# Patient Record
Sex: Male | Born: 1980 | Race: White | Hispanic: No | Marital: Married | Smoking: Never smoker
Health system: Southern US, Community
[De-identification: ages and names within clinical notes are randomized; demographics above are authoritative.]

## PROBLEM LIST (undated history)

## (undated) DIAGNOSIS — F509 Eating disorder, unspecified: Secondary | ICD-10-CM

## (undated) DIAGNOSIS — F419 Anxiety disorder, unspecified: Secondary | ICD-10-CM

## (undated) DIAGNOSIS — K219 Gastro-esophageal reflux disease without esophagitis: Secondary | ICD-10-CM

## (undated) DIAGNOSIS — T7840XA Allergy, unspecified, initial encounter: Secondary | ICD-10-CM

## (undated) HISTORY — DX: Gastro-esophageal reflux disease without esophagitis: K21.9

## (undated) HISTORY — PX: OTHER SURGICAL HISTORY: SHX169

## (undated) HISTORY — PX: TESTICLE REMOVAL: SHX68

## (undated) HISTORY — DX: Eating disorder, unspecified: F50.9

## (undated) HISTORY — DX: Allergy, unspecified, initial encounter: T78.40XA

## (undated) HISTORY — DX: Anxiety disorder, unspecified: F41.9

---
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
## 2016-11-13 DIAGNOSIS — R12 Heartburn: Secondary | ICD-10-CM | POA: Insufficient documentation

## 2020-01-26 ENCOUNTER — Ambulatory Visit: Payer: Self-pay | Attending: Internal Medicine

## 2020-01-26 DIAGNOSIS — Z23 Encounter for immunization: Secondary | ICD-10-CM

## 2020-01-26 NOTE — Progress Notes (Signed)
   Covid-19 Vaccination Clinic  Name:  Kerney Hopfensperger    MRN: [id number] DOB: [birth year]  01/26/2020  Mr. January was observed post Covid-19 immunization for 30 minutes based on pre-vaccination screening without incident. He was provided with Vaccine Information Sheet and instruction to access the V-Safe system.   Mr. Voong was instructed to call 911 with any severe reactions post vaccine: Marland Kitchen Difficulty breathing  . Swelling of face and throat  . A fast heartbeat  . A bad rash all over body  . Dizziness and weakness   Immunizations Administered    Name Date Dose VIS Date Route   Pfizer COVID-19 Vaccine 01/26/2020  2:15 PM 0.3 mL 10/13/2019 Intramuscular   Manufacturer: [employer], [employer]   Lot: [id number]   NDC: [address]-4

## 2020-02-16 ENCOUNTER — Ambulatory Visit: Payer: Self-pay | Attending: Internal Medicine

## 2020-02-16 DIAGNOSIS — Z23 Encounter for immunization: Secondary | ICD-10-CM

## 2020-02-16 NOTE — Progress Notes (Signed)
   Covid-19 Vaccination Clinic  Name:  Antonio Porter    MRN: [id number] DOB: 1981/10/12  02/16/2020  Mr. Antonio Porter was observed post Covid-19 immunization for 15 minutes without incident. He was provided with Vaccine Information Sheet and instruction to access the V-Safe system.   Mr. Antonio Porter was instructed to call 911 with any severe reactions post vaccine: Marland Kitchen Difficulty breathing  . Swelling of face and throat  . A fast heartbeat  . A bad rash all over body  . Dizziness and weakness   Immunizations Administered    Name Date Dose VIS Date Route   Pfizer COVID-19 Vaccine 02/16/2020  2:03 PM 0.3 mL 10/13/2019 Intramuscular   Manufacturer: [employer], [employer]   Lot: [id number]   NDC: [address]-6

## 2020-03-05 DIAGNOSIS — F411 Generalized anxiety disorder: Secondary | ICD-10-CM | POA: Diagnosis not present

## 2020-03-19 DIAGNOSIS — F411 Generalized anxiety disorder: Secondary | ICD-10-CM | POA: Diagnosis not present

## 2020-04-02 DIAGNOSIS — F411 Generalized anxiety disorder: Secondary | ICD-10-CM | POA: Diagnosis not present

## 2020-04-16 DIAGNOSIS — Z713 Dietary counseling and surveillance: Secondary | ICD-10-CM | POA: Diagnosis not present

## 2020-04-16 DIAGNOSIS — F411 Generalized anxiety disorder: Secondary | ICD-10-CM | POA: Diagnosis not present

## 2020-04-30 DIAGNOSIS — F411 Generalized anxiety disorder: Secondary | ICD-10-CM | POA: Diagnosis not present

## 2020-05-22 DIAGNOSIS — Z713 Dietary counseling and surveillance: Secondary | ICD-10-CM | POA: Diagnosis not present

## 2020-06-11 DIAGNOSIS — F411 Generalized anxiety disorder: Secondary | ICD-10-CM | POA: Diagnosis not present

## 2020-06-14 DIAGNOSIS — Z713 Dietary counseling and surveillance: Secondary | ICD-10-CM | POA: Diagnosis not present

## 2020-06-17 ENCOUNTER — Other Ambulatory Visit (HOSPITAL_COMMUNITY): Payer: Self-pay | Admitting: Gastroenterology

## 2020-06-17 DIAGNOSIS — K3184 Gastroparesis: Secondary | ICD-10-CM | POA: Diagnosis not present

## 2020-06-17 DIAGNOSIS — R634 Abnormal weight loss: Secondary | ICD-10-CM

## 2020-06-18 ENCOUNTER — Other Ambulatory Visit (HOSPITAL_COMMUNITY): Payer: Self-pay | Admitting: Gastroenterology

## 2020-06-24 ENCOUNTER — Telehealth (HOSPITAL_COMMUNITY): Payer: Self-pay

## 2020-06-24 ENCOUNTER — Other Ambulatory Visit: Payer: Self-pay | Admitting: Gastroenterology

## 2020-06-24 DIAGNOSIS — R634 Abnormal weight loss: Secondary | ICD-10-CM

## 2020-06-24 DIAGNOSIS — K3184 Gastroparesis: Secondary | ICD-10-CM

## 2020-06-26 ENCOUNTER — Ambulatory Visit: Payer: BC Managed Care – PPO

## 2020-06-28 ENCOUNTER — Ambulatory Visit
Admission: RE | Admit: 2020-06-28 | Discharge: 2020-06-28 | Disposition: A | Discharge status: Home / Self Care | Payer: BC Managed Care – PPO | Source: Ambulatory Visit | Attending: Gastroenterology | Admitting: Gastroenterology

## 2020-06-28 ENCOUNTER — Other Ambulatory Visit: Payer: Self-pay

## 2020-06-28 DIAGNOSIS — K3184 Gastroparesis: Secondary | ICD-10-CM | POA: Insufficient documentation

## 2020-06-28 DIAGNOSIS — R109 Unspecified abdominal pain: Secondary | ICD-10-CM | POA: Diagnosis not present

## 2020-06-28 DIAGNOSIS — R634 Abnormal weight loss: Secondary | ICD-10-CM | POA: Diagnosis not present

## 2020-06-28 DIAGNOSIS — R140 Abdominal distension (gaseous): Secondary | ICD-10-CM | POA: Diagnosis not present

## 2020-06-28 IMAGING — NM NM GASTRIC EMPTYING
5 series · 16 of 16 positions shown · non-contrast
Comparison: None.

CLINICAL DATA: Abdominal pain and bloating, with progression over
last several years. Anorexia.

EXAM:
NUCLEAR MEDICINE GASTRIC EMPTYING SCAN
TECHNIQUE: After oral ingestion of radiolabeled meal, sequential abdominal
images were obtained for 4 hours. Percentage of activity emptying
the stomach was calculated at 1 hour, 2 hour, 3 hour, and 4 hours.
RADIOPHARMACEUTICALS:  1.8 mCi [UE] sulfur colloid in standardized
meal

[Series 1000: gastric statics (results) · 3.90mm/px · 4 acquisitions, 8 frames shown]
[im 1/4]
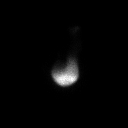
[im 1/4]
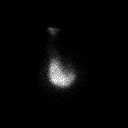
[im 2/4]
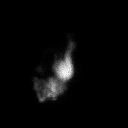
[im 2/4]
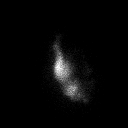
[im 3/4]
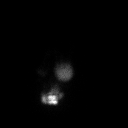
[im 3/4]
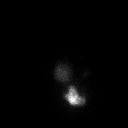
[im 4/4]
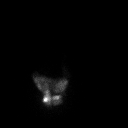
[im 4/4]
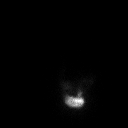

[Series 1000: gastric statics · 3.90mm/px · 2 of 2 frames shown (1 of 4)]
[frame 1/2]
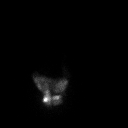
[frame 2/2]
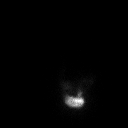

[Series 1000: gastric statics · 3.90mm/px · 2 of 2 frames shown (2 of 4)]
[frame 1/2]
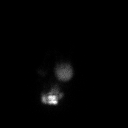
[frame 2/2]
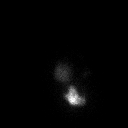

[Series 1000: gastric statics · 3.90mm/px · 2 of 2 frames shown (3 of 4)]
[frame 1/2]
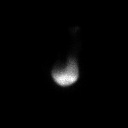
[frame 2/2]
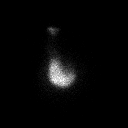

[Series 1000: gastric statics · 3.90mm/px · 2 of 2 frames shown (4 of 4)]
[frame 1/2]
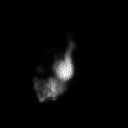
[frame 2/2]
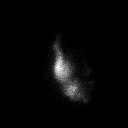

[16 of 16 positions shown; findings below may reference images not displayed]

FINDINGS: Expected location of the stomach in the left upper quadrant.
Ingested meal empties the stomach gradually over the course of the
study.

37% emptied at 1 hr ( normal >= 10%)

72% emptied at 2 hr ( normal >= 40%)

98% emptied at 3 hr ( normal >= 70%)

> 98% emptied at 4 hr ( normal >= 90%)
IMPRESSION: Normal gastric emptying study.

## 2020-06-28 MED ORDER — TECHNETIUM TC 99M SULFUR COLLOID
2.0000 | Freq: Once | INTRAVENOUS | Status: AC | PRN
  Administered 2020-06-28: 1.809 via ORAL

## 2020-07-30 DIAGNOSIS — Z713 Dietary counseling and surveillance: Secondary | ICD-10-CM | POA: Diagnosis not present

## 2020-07-31 DIAGNOSIS — K3184 Gastroparesis: Secondary | ICD-10-CM | POA: Diagnosis not present

## 2020-07-31 DIAGNOSIS — R634 Abnormal weight loss: Secondary | ICD-10-CM | POA: Diagnosis not present

## 2020-08-16 DIAGNOSIS — H52223 Regular astigmatism, bilateral: Secondary | ICD-10-CM | POA: Diagnosis not present

## 2020-08-16 DIAGNOSIS — H5213 Myopia, bilateral: Secondary | ICD-10-CM | POA: Diagnosis not present

## 2020-08-29 DIAGNOSIS — Z713 Dietary counseling and surveillance: Secondary | ICD-10-CM | POA: Diagnosis not present

## 2020-10-02 ENCOUNTER — Encounter: Payer: Self-pay | Admitting: Urology

## 2020-10-02 ENCOUNTER — Ambulatory Visit (INDEPENDENT_AMBULATORY_CARE_PROVIDER_SITE_OTHER): Payer: BC Managed Care – PPO | Admitting: Urology

## 2020-10-02 ENCOUNTER — Other Ambulatory Visit: Payer: Self-pay

## 2020-10-02 VITALS — BP 120/82 | HR 74 | Ht 70.0 in | Wt 150.0 lb

## 2020-10-02 DIAGNOSIS — Z3009 Encounter for other general counseling and advice on contraception: Secondary | ICD-10-CM

## 2020-10-02 MED ORDER — DIAZEPAM 10 MG PO TABS: ORAL_TABLET | ORAL | 0 refills | Status: DC

## 2020-10-02 NOTE — Patient Instructions (Signed)

## 2020-10-02 NOTE — Progress Notes (Signed)
10/02/2020 9:28 AM   Antonio Porter 12-Oct-1981 [id number]  Referring provider: No referring provider defined for this encounter.  Chief Complaint  Patient presents with  . VAS Consult    HPI: Antonio Porter is a 39y.o. male who presents for vasectomy counseling.  . Married with 2 children . 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 No previous history inguinal/pelvic hernia . No history of bleeding or clotting disorders   PMH: No past medical history on file.  Surgical History: Right orchidopexy Right orchiectomy  Home Medications:  Allergies as of 10/02/2020      Reactions   Other Hives   IV DYE-HIVES IN ARMPITS      Medication List       Accurate as of October 02, 2020  9:28 AM. If you have any questions, ask your nurse or doctor.        RABEprazole 20 MG tablet Commonly known as: ACIPHEX       Allergies:  Allergies  Allergen Reactions  . Other Hives    IV DYE-HIVES IN ARMPITS    Family History: No family history on file.  Social History:  reports that he has never smoked. He has never used smokeless tobacco. He reports current alcohol use. No history on file for drug use.   Physical Exam: BP 120/82   Pulse 74   Ht 5\' 10"  (1.778 m)   Wt 150 lb (68 kg)   BMI 21.52 kg/m   Constitutional:  Alert, No acute distress. HEENT: [place] AT, moist mucus membranes.  Trachea midline, no masses. Cardiovascular: No clubbing, cyanosis, or edema. Respiratory: Normal respiratory effort, no increased work of breathing. GU: Phallus circumcised without lesions.  Left testis descended, palpably normal.  Left vas easily palpable Skin: No rashes, bruises or suspicious lesions. Neurologic: Grossly intact, no focal deficits, moving all 4 extremities. Psychiatric: Normal mood and affect.   Assessment & Plan:    1.  Undesired fertility . Desires to schedule vasectomy . We had a long discussion about vasectomy. We  specifically discussed the procedure, recovery and the risks, benefits and alternatives of vasectomy. I explained that the procedure entails removal of a segment of each vas deferens, each of which conducts sperm, and that the purpose of this procedure is to cause sterility (inability to produce children or cause pregnancy). Vasectomy is intended to be permanent and irreversible form of contraception. Options for fertility after vasectomy include vasectomy reversal, or sperm retrieval with in vitro fertilization. These options are not always successful, and they may be expensive. We discussed reversible forms of birth control such as condoms, IUD or diaphragms, as well as the option of freezing sperm in a sperm bank prior to the vasectomy procedure. We discussed the importance of avoiding strenuous exercise for four days after vasectomy, and the importance of refraining from any form of ejaculation for seven days after vasectomy. I explained that vasectomy does not produce immediate sterility so another form of contraceptive must be used until sterility is assured by having semen checked for sperm. Thus, a post vasectomy semen analysis is necessary to confirm sterility. Rarely, vasectomy must be repeated. We discussed the approximately 1 in 2,000 risk of pregnancy after vasectomy for men who have post-vasectomy semen analysis showing absent sperm or rare non-motile sperm. Typical side effects include a small amount of oozing blood, some discomfort and mild swelling in the area of incision.  Vasectomy does not affect sexual performance, function, please, sensation, interest,  desire, satisfaction, penile erection, volume of semen or ejaculation. Other rare risks include allergy or adverse reaction to an anesthetic, testicular atrophy, hematoma, infection/abscess, prolonged tenderness of the vas deferens, pain, swelling, painful nodule or scar (called sperm granuloma) or epididymtis. We discussed chronic testicular  pain syndrome. This has been reported to occur in as many as 1-2% of men and may be permanent. This can be treated with medication, small procedures or (rarely) surgery. . Valium 10 mg as a preprocedure anxiolytic sent to pharmacy and he was informed he would need a driver if utilizing this medication    Riki Altes, MD  [facility] [address] [place], [place] [address] [phone]

## 2020-10-06 ENCOUNTER — Encounter: Payer: Self-pay | Admitting: Urology

## 2020-11-02 HISTORY — PX: VASECTOMY: SHX75

## 2020-11-13 DIAGNOSIS — F411 Generalized anxiety disorder: Secondary | ICD-10-CM | POA: Diagnosis not present

## 2020-11-15 ENCOUNTER — Encounter: Payer: Self-pay | Admitting: Urology

## 2020-11-15 ENCOUNTER — Ambulatory Visit (INDEPENDENT_AMBULATORY_CARE_PROVIDER_SITE_OTHER): Payer: BC Managed Care – PPO | Admitting: Urology

## 2020-11-15 ENCOUNTER — Other Ambulatory Visit: Payer: Self-pay

## 2020-11-15 VITALS — BP 101/65 | HR 67 | Ht 70.0 in | Wt 150.0 lb

## 2020-11-15 DIAGNOSIS — Z302 Encounter for sterilization: Secondary | ICD-10-CM

## 2020-11-15 MED ORDER — HYDROCODONE-ACETAMINOPHEN 5-325 MG PO TABS: 1.0000 | ORAL_TABLET | ORAL | 0 refills | Status: DC | PRN

## 2020-11-15 NOTE — Patient Instructions (Signed)

## 2020-11-18 NOTE — Progress Notes (Signed)
Vasectomy Procedure Note  Indications: The patient is a 40 y.o. male who presents today for elective sterilization.  He has been consented for the procedure.  He is aware of the risks and benefits.  He had no additional questions.  He agrees to proceed.  He denies any other significant change since his last visit.  Prior right orchiectomy  Pre-operative Diagnosis: Elective sterilization  Post-operative Diagnosis: Elective sterilization  Premedication: Valium 10 mg po  Surgeon: Lorin Picket C. Audree Schrecengost, M.D  Description: The patient was prepped and draped in the standard fashion.  The left vas deferens was identified and brought superiorly to the anterior scrotal skin.  The skin and vas was then anesthetized utilizing 5 ml 1% lidocaine.  A small stab incision was made and spread with the vas dissector.  The vas was grasped utilizing the vas clamp and elevated out of the incision.  The vas was dissected free from surrounding tissue and vessels and an ~1 cm segment was excised.  The vas lumens were cauterized utilizing electrocautery.  The distal segment was buried in the surrounding sheath with a 3-0 chromic suture.  No significant bleeding was observed.  The vas ends were then dropped back into the hemiscrotum.  The skin was closed with hemostatic pressure.  Complications:None  Recommendations: 1.  No lifting greater than 10 pounds or strenuousactivity for 1 week. 2.  Scrotal support for 1 week. 3.  Shower only for 1 week; may shower in the morning 4.  May resume intercourse in one week if no significant discomfort.  Continue alternate contraception for 12 weeks.  5.  Call for significant pain, swelling, redness, drainage or fever greater than 100.5. 6.  Rx hydrocodone/APAP 5/325 1-2 every 6 hours as needed for pain. 7.  Follow-up semen analysis in 12 weeks.  Irineo Axon, MD

## 2020-11-20 DIAGNOSIS — F411 Generalized anxiety disorder: Secondary | ICD-10-CM | POA: Diagnosis not present

## 2020-11-27 DIAGNOSIS — F411 Generalized anxiety disorder: Secondary | ICD-10-CM | POA: Diagnosis not present

## 2020-12-04 DIAGNOSIS — F411 Generalized anxiety disorder: Secondary | ICD-10-CM | POA: Diagnosis not present

## 2020-12-11 DIAGNOSIS — F411 Generalized anxiety disorder: Secondary | ICD-10-CM | POA: Diagnosis not present

## 2020-12-18 DIAGNOSIS — F411 Generalized anxiety disorder: Secondary | ICD-10-CM | POA: Diagnosis not present

## 2020-12-25 DIAGNOSIS — F411 Generalized anxiety disorder: Secondary | ICD-10-CM | POA: Diagnosis not present

## 2021-01-01 DIAGNOSIS — K3184 Gastroparesis: Secondary | ICD-10-CM | POA: Diagnosis not present

## 2021-01-01 DIAGNOSIS — F411 Generalized anxiety disorder: Secondary | ICD-10-CM | POA: Diagnosis not present

## 2021-01-01 DIAGNOSIS — R634 Abnormal weight loss: Secondary | ICD-10-CM | POA: Diagnosis not present

## 2021-01-08 DIAGNOSIS — F411 Generalized anxiety disorder: Secondary | ICD-10-CM | POA: Diagnosis not present

## 2021-01-15 DIAGNOSIS — F5089 Other specified eating disorder: Secondary | ICD-10-CM | POA: Diagnosis not present

## 2021-01-15 DIAGNOSIS — K5900 Constipation, unspecified: Secondary | ICD-10-CM | POA: Diagnosis not present

## 2021-01-15 DIAGNOSIS — E639 Nutritional deficiency, unspecified: Secondary | ICD-10-CM | POA: Diagnosis not present

## 2021-01-15 DIAGNOSIS — K3184 Gastroparesis: Secondary | ICD-10-CM | POA: Diagnosis not present

## 2021-01-15 DIAGNOSIS — R61 Generalized hyperhidrosis: Secondary | ICD-10-CM | POA: Diagnosis not present

## 2021-01-15 DIAGNOSIS — F411 Generalized anxiety disorder: Secondary | ICD-10-CM | POA: Diagnosis not present

## 2021-01-15 DIAGNOSIS — R140 Abdominal distension (gaseous): Secondary | ICD-10-CM | POA: Diagnosis not present

## 2021-01-15 DIAGNOSIS — F5082 Avoidant/restrictive food intake disorder: Secondary | ICD-10-CM | POA: Diagnosis not present

## 2021-01-15 DIAGNOSIS — E46 Unspecified protein-calorie malnutrition: Secondary | ICD-10-CM | POA: Diagnosis not present

## 2021-01-15 DIAGNOSIS — F509 Eating disorder, unspecified: Secondary | ICD-10-CM | POA: Insufficient documentation

## 2021-01-16 DIAGNOSIS — K5900 Constipation, unspecified: Secondary | ICD-10-CM | POA: Diagnosis not present

## 2021-01-16 DIAGNOSIS — K3184 Gastroparesis: Secondary | ICD-10-CM | POA: Diagnosis not present

## 2021-01-16 DIAGNOSIS — E46 Unspecified protein-calorie malnutrition: Secondary | ICD-10-CM | POA: Diagnosis not present

## 2021-01-16 DIAGNOSIS — F5089 Other specified eating disorder: Secondary | ICD-10-CM | POA: Diagnosis not present

## 2021-01-17 DIAGNOSIS — R140 Abdominal distension (gaseous): Secondary | ICD-10-CM | POA: Diagnosis not present

## 2021-01-17 DIAGNOSIS — F5082 Avoidant/restrictive food intake disorder: Secondary | ICD-10-CM | POA: Diagnosis not present

## 2021-01-21 DIAGNOSIS — F411 Generalized anxiety disorder: Secondary | ICD-10-CM | POA: Diagnosis not present

## 2021-01-21 DIAGNOSIS — R140 Abdominal distension (gaseous): Secondary | ICD-10-CM | POA: Diagnosis not present

## 2021-01-23 DIAGNOSIS — F5082 Avoidant/restrictive food intake disorder: Secondary | ICD-10-CM | POA: Diagnosis not present

## 2021-01-23 DIAGNOSIS — E46 Unspecified protein-calorie malnutrition: Secondary | ICD-10-CM | POA: Diagnosis not present

## 2021-01-23 DIAGNOSIS — K5900 Constipation, unspecified: Secondary | ICD-10-CM | POA: Diagnosis not present

## 2021-01-23 DIAGNOSIS — K3184 Gastroparesis: Secondary | ICD-10-CM | POA: Diagnosis not present

## 2021-01-24 DIAGNOSIS — F5089 Other specified eating disorder: Secondary | ICD-10-CM | POA: Diagnosis not present

## 2021-01-28 DIAGNOSIS — R140 Abdominal distension (gaseous): Secondary | ICD-10-CM | POA: Diagnosis not present

## 2021-01-28 DIAGNOSIS — F5089 Other specified eating disorder: Secondary | ICD-10-CM | POA: Diagnosis not present

## 2021-01-29 DIAGNOSIS — K3184 Gastroparesis: Secondary | ICD-10-CM | POA: Diagnosis not present

## 2021-01-29 DIAGNOSIS — F5082 Avoidant/restrictive food intake disorder: Secondary | ICD-10-CM | POA: Diagnosis not present

## 2021-01-29 DIAGNOSIS — K5900 Constipation, unspecified: Secondary | ICD-10-CM | POA: Diagnosis not present

## 2021-01-29 DIAGNOSIS — E46 Unspecified protein-calorie malnutrition: Secondary | ICD-10-CM | POA: Diagnosis not present

## 2021-01-31 DIAGNOSIS — F5082 Avoidant/restrictive food intake disorder: Secondary | ICD-10-CM | POA: Diagnosis not present

## 2021-01-31 DIAGNOSIS — R61 Generalized hyperhidrosis: Secondary | ICD-10-CM | POA: Diagnosis not present

## 2021-01-31 DIAGNOSIS — R140 Abdominal distension (gaseous): Secondary | ICD-10-CM | POA: Diagnosis not present

## 2021-02-05 DIAGNOSIS — E46 Unspecified protein-calorie malnutrition: Secondary | ICD-10-CM | POA: Diagnosis not present

## 2021-02-05 DIAGNOSIS — F5089 Other specified eating disorder: Secondary | ICD-10-CM | POA: Diagnosis not present

## 2021-02-05 DIAGNOSIS — F5082 Avoidant/restrictive food intake disorder: Secondary | ICD-10-CM | POA: Diagnosis not present

## 2021-02-05 DIAGNOSIS — R140 Abdominal distension (gaseous): Secondary | ICD-10-CM | POA: Diagnosis not present

## 2021-02-05 DIAGNOSIS — K5900 Constipation, unspecified: Secondary | ICD-10-CM | POA: Diagnosis not present

## 2021-02-05 DIAGNOSIS — K3184 Gastroparesis: Secondary | ICD-10-CM | POA: Diagnosis not present

## 2021-02-07 DIAGNOSIS — R61 Generalized hyperhidrosis: Secondary | ICD-10-CM | POA: Diagnosis not present

## 2021-02-07 DIAGNOSIS — F5089 Other specified eating disorder: Secondary | ICD-10-CM | POA: Diagnosis not present

## 2021-02-07 DIAGNOSIS — R140 Abdominal distension (gaseous): Secondary | ICD-10-CM | POA: Diagnosis not present

## 2021-02-08 DIAGNOSIS — K5900 Constipation, unspecified: Secondary | ICD-10-CM | POA: Diagnosis not present

## 2021-02-08 DIAGNOSIS — R140 Abdominal distension (gaseous): Secondary | ICD-10-CM | POA: Diagnosis not present

## 2021-02-08 DIAGNOSIS — F5082 Avoidant/restrictive food intake disorder: Secondary | ICD-10-CM | POA: Diagnosis not present

## 2021-02-08 DIAGNOSIS — E46 Unspecified protein-calorie malnutrition: Secondary | ICD-10-CM | POA: Diagnosis not present

## 2021-02-11 ENCOUNTER — Other Ambulatory Visit: Payer: BC Managed Care – PPO

## 2021-02-11 DIAGNOSIS — K3184 Gastroparesis: Secondary | ICD-10-CM | POA: Diagnosis not present

## 2021-02-11 DIAGNOSIS — E46 Unspecified protein-calorie malnutrition: Secondary | ICD-10-CM | POA: Diagnosis not present

## 2021-02-11 DIAGNOSIS — F5082 Avoidant/restrictive food intake disorder: Secondary | ICD-10-CM | POA: Diagnosis not present

## 2021-02-11 DIAGNOSIS — K5900 Constipation, unspecified: Secondary | ICD-10-CM | POA: Diagnosis not present

## 2021-02-12 DIAGNOSIS — F411 Generalized anxiety disorder: Secondary | ICD-10-CM | POA: Diagnosis not present

## 2021-02-12 DIAGNOSIS — R140 Abdominal distension (gaseous): Secondary | ICD-10-CM | POA: Diagnosis not present

## 2021-02-12 DIAGNOSIS — F5089 Other specified eating disorder: Secondary | ICD-10-CM | POA: Diagnosis not present

## 2021-02-12 DIAGNOSIS — F5082 Avoidant/restrictive food intake disorder: Secondary | ICD-10-CM | POA: Diagnosis not present

## 2021-02-13 DIAGNOSIS — R140 Abdominal distension (gaseous): Secondary | ICD-10-CM | POA: Diagnosis not present

## 2021-02-13 DIAGNOSIS — F5089 Other specified eating disorder: Secondary | ICD-10-CM | POA: Diagnosis not present

## 2021-02-13 DIAGNOSIS — F411 Generalized anxiety disorder: Secondary | ICD-10-CM | POA: Diagnosis not present

## 2021-02-13 DIAGNOSIS — F5082 Avoidant/restrictive food intake disorder: Secondary | ICD-10-CM | POA: Diagnosis not present

## 2021-02-14 DIAGNOSIS — F5082 Avoidant/restrictive food intake disorder: Secondary | ICD-10-CM | POA: Diagnosis not present

## 2021-02-14 DIAGNOSIS — F411 Generalized anxiety disorder: Secondary | ICD-10-CM | POA: Diagnosis not present

## 2021-02-14 DIAGNOSIS — F5089 Other specified eating disorder: Secondary | ICD-10-CM | POA: Diagnosis not present

## 2021-02-14 DIAGNOSIS — R140 Abdominal distension (gaseous): Secondary | ICD-10-CM | POA: Diagnosis not present

## 2021-02-15 DIAGNOSIS — F5082 Avoidant/restrictive food intake disorder: Secondary | ICD-10-CM | POA: Diagnosis not present

## 2021-02-15 DIAGNOSIS — R140 Abdominal distension (gaseous): Secondary | ICD-10-CM | POA: Diagnosis not present

## 2021-02-15 DIAGNOSIS — F5089 Other specified eating disorder: Secondary | ICD-10-CM | POA: Diagnosis not present

## 2021-02-15 DIAGNOSIS — F411 Generalized anxiety disorder: Secondary | ICD-10-CM | POA: Diagnosis not present

## 2021-02-17 DIAGNOSIS — F5089 Other specified eating disorder: Secondary | ICD-10-CM | POA: Diagnosis not present

## 2021-02-17 DIAGNOSIS — F5082 Avoidant/restrictive food intake disorder: Secondary | ICD-10-CM | POA: Diagnosis not present

## 2021-02-17 DIAGNOSIS — R140 Abdominal distension (gaseous): Secondary | ICD-10-CM | POA: Diagnosis not present

## 2021-02-17 DIAGNOSIS — F411 Generalized anxiety disorder: Secondary | ICD-10-CM | POA: Diagnosis not present

## 2021-02-18 DIAGNOSIS — F411 Generalized anxiety disorder: Secondary | ICD-10-CM | POA: Diagnosis not present

## 2021-02-18 DIAGNOSIS — F5089 Other specified eating disorder: Secondary | ICD-10-CM | POA: Diagnosis not present

## 2021-02-18 DIAGNOSIS — F5082 Avoidant/restrictive food intake disorder: Secondary | ICD-10-CM | POA: Diagnosis not present

## 2021-02-18 DIAGNOSIS — R140 Abdominal distension (gaseous): Secondary | ICD-10-CM | POA: Diagnosis not present

## 2021-02-19 DIAGNOSIS — F411 Generalized anxiety disorder: Secondary | ICD-10-CM | POA: Diagnosis not present

## 2021-02-19 DIAGNOSIS — F5089 Other specified eating disorder: Secondary | ICD-10-CM | POA: Diagnosis not present

## 2021-02-19 DIAGNOSIS — R140 Abdominal distension (gaseous): Secondary | ICD-10-CM | POA: Diagnosis not present

## 2021-02-19 DIAGNOSIS — F5082 Avoidant/restrictive food intake disorder: Secondary | ICD-10-CM | POA: Diagnosis not present

## 2021-02-20 DIAGNOSIS — F5082 Avoidant/restrictive food intake disorder: Secondary | ICD-10-CM | POA: Diagnosis not present

## 2021-02-20 DIAGNOSIS — F411 Generalized anxiety disorder: Secondary | ICD-10-CM | POA: Diagnosis not present

## 2021-02-20 DIAGNOSIS — R140 Abdominal distension (gaseous): Secondary | ICD-10-CM | POA: Diagnosis not present

## 2021-02-20 DIAGNOSIS — F5089 Other specified eating disorder: Secondary | ICD-10-CM | POA: Diagnosis not present

## 2021-02-21 DIAGNOSIS — F5082 Avoidant/restrictive food intake disorder: Secondary | ICD-10-CM | POA: Diagnosis not present

## 2021-02-21 DIAGNOSIS — F411 Generalized anxiety disorder: Secondary | ICD-10-CM | POA: Diagnosis not present

## 2021-02-21 DIAGNOSIS — R140 Abdominal distension (gaseous): Secondary | ICD-10-CM | POA: Diagnosis not present

## 2021-02-21 DIAGNOSIS — F5089 Other specified eating disorder: Secondary | ICD-10-CM | POA: Diagnosis not present

## 2021-02-22 DIAGNOSIS — F5089 Other specified eating disorder: Secondary | ICD-10-CM | POA: Diagnosis not present

## 2021-02-22 DIAGNOSIS — R140 Abdominal distension (gaseous): Secondary | ICD-10-CM | POA: Diagnosis not present

## 2021-02-22 DIAGNOSIS — F411 Generalized anxiety disorder: Secondary | ICD-10-CM | POA: Diagnosis not present

## 2021-02-22 DIAGNOSIS — F5082 Avoidant/restrictive food intake disorder: Secondary | ICD-10-CM | POA: Diagnosis not present

## 2021-02-24 DIAGNOSIS — F5089 Other specified eating disorder: Secondary | ICD-10-CM | POA: Diagnosis not present

## 2021-02-24 DIAGNOSIS — F5082 Avoidant/restrictive food intake disorder: Secondary | ICD-10-CM | POA: Diagnosis not present

## 2021-02-24 DIAGNOSIS — F411 Generalized anxiety disorder: Secondary | ICD-10-CM | POA: Diagnosis not present

## 2021-02-24 DIAGNOSIS — R140 Abdominal distension (gaseous): Secondary | ICD-10-CM | POA: Diagnosis not present

## 2021-02-25 DIAGNOSIS — F5089 Other specified eating disorder: Secondary | ICD-10-CM | POA: Diagnosis not present

## 2021-02-25 DIAGNOSIS — F5082 Avoidant/restrictive food intake disorder: Secondary | ICD-10-CM | POA: Diagnosis not present

## 2021-02-25 DIAGNOSIS — R140 Abdominal distension (gaseous): Secondary | ICD-10-CM | POA: Diagnosis not present

## 2021-02-25 DIAGNOSIS — F411 Generalized anxiety disorder: Secondary | ICD-10-CM | POA: Diagnosis not present

## 2021-02-26 DIAGNOSIS — F5089 Other specified eating disorder: Secondary | ICD-10-CM | POA: Diagnosis not present

## 2021-02-26 DIAGNOSIS — F5082 Avoidant/restrictive food intake disorder: Secondary | ICD-10-CM | POA: Diagnosis not present

## 2021-02-26 DIAGNOSIS — R140 Abdominal distension (gaseous): Secondary | ICD-10-CM | POA: Diagnosis not present

## 2021-02-26 DIAGNOSIS — F411 Generalized anxiety disorder: Secondary | ICD-10-CM | POA: Diagnosis not present

## 2021-02-27 DIAGNOSIS — F411 Generalized anxiety disorder: Secondary | ICD-10-CM | POA: Diagnosis not present

## 2021-02-27 DIAGNOSIS — R140 Abdominal distension (gaseous): Secondary | ICD-10-CM | POA: Diagnosis not present

## 2021-02-27 DIAGNOSIS — F5089 Other specified eating disorder: Secondary | ICD-10-CM | POA: Diagnosis not present

## 2021-02-27 DIAGNOSIS — F5082 Avoidant/restrictive food intake disorder: Secondary | ICD-10-CM | POA: Diagnosis not present

## 2021-02-28 DIAGNOSIS — F5089 Other specified eating disorder: Secondary | ICD-10-CM | POA: Diagnosis not present

## 2021-02-28 DIAGNOSIS — R140 Abdominal distension (gaseous): Secondary | ICD-10-CM | POA: Diagnosis not present

## 2021-02-28 DIAGNOSIS — F5082 Avoidant/restrictive food intake disorder: Secondary | ICD-10-CM | POA: Diagnosis not present

## 2021-02-28 DIAGNOSIS — F411 Generalized anxiety disorder: Secondary | ICD-10-CM | POA: Diagnosis not present

## 2021-03-01 DIAGNOSIS — R140 Abdominal distension (gaseous): Secondary | ICD-10-CM | POA: Diagnosis not present

## 2021-03-01 DIAGNOSIS — F411 Generalized anxiety disorder: Secondary | ICD-10-CM | POA: Diagnosis not present

## 2021-03-01 DIAGNOSIS — F5082 Avoidant/restrictive food intake disorder: Secondary | ICD-10-CM | POA: Diagnosis not present

## 2021-03-01 DIAGNOSIS — F5089 Other specified eating disorder: Secondary | ICD-10-CM | POA: Diagnosis not present

## 2021-03-03 DIAGNOSIS — F5089 Other specified eating disorder: Secondary | ICD-10-CM | POA: Diagnosis not present

## 2021-03-03 DIAGNOSIS — R140 Abdominal distension (gaseous): Secondary | ICD-10-CM | POA: Diagnosis not present

## 2021-03-03 DIAGNOSIS — F5082 Avoidant/restrictive food intake disorder: Secondary | ICD-10-CM | POA: Diagnosis not present

## 2021-03-03 DIAGNOSIS — F411 Generalized anxiety disorder: Secondary | ICD-10-CM | POA: Diagnosis not present

## 2021-03-04 DIAGNOSIS — F411 Generalized anxiety disorder: Secondary | ICD-10-CM | POA: Diagnosis not present

## 2021-03-04 DIAGNOSIS — F5082 Avoidant/restrictive food intake disorder: Secondary | ICD-10-CM | POA: Diagnosis not present

## 2021-03-04 DIAGNOSIS — F5089 Other specified eating disorder: Secondary | ICD-10-CM | POA: Diagnosis not present

## 2021-03-04 DIAGNOSIS — R140 Abdominal distension (gaseous): Secondary | ICD-10-CM | POA: Diagnosis not present

## 2021-03-05 DIAGNOSIS — F411 Generalized anxiety disorder: Secondary | ICD-10-CM | POA: Diagnosis not present

## 2021-03-05 DIAGNOSIS — F5089 Other specified eating disorder: Secondary | ICD-10-CM | POA: Diagnosis not present

## 2021-03-05 DIAGNOSIS — F5082 Avoidant/restrictive food intake disorder: Secondary | ICD-10-CM | POA: Diagnosis not present

## 2021-03-05 DIAGNOSIS — R140 Abdominal distension (gaseous): Secondary | ICD-10-CM | POA: Diagnosis not present

## 2021-03-06 DIAGNOSIS — F5082 Avoidant/restrictive food intake disorder: Secondary | ICD-10-CM | POA: Diagnosis not present

## 2021-03-06 DIAGNOSIS — F411 Generalized anxiety disorder: Secondary | ICD-10-CM | POA: Diagnosis not present

## 2021-03-06 DIAGNOSIS — R140 Abdominal distension (gaseous): Secondary | ICD-10-CM | POA: Diagnosis not present

## 2021-03-06 DIAGNOSIS — F5089 Other specified eating disorder: Secondary | ICD-10-CM | POA: Diagnosis not present

## 2021-03-07 DIAGNOSIS — F5082 Avoidant/restrictive food intake disorder: Secondary | ICD-10-CM | POA: Diagnosis not present

## 2021-03-07 DIAGNOSIS — F411 Generalized anxiety disorder: Secondary | ICD-10-CM | POA: Diagnosis not present

## 2021-03-07 DIAGNOSIS — F5089 Other specified eating disorder: Secondary | ICD-10-CM | POA: Diagnosis not present

## 2021-03-07 DIAGNOSIS — R140 Abdominal distension (gaseous): Secondary | ICD-10-CM | POA: Diagnosis not present

## 2021-03-10 DIAGNOSIS — K3184 Gastroparesis: Secondary | ICD-10-CM | POA: Diagnosis not present

## 2021-03-10 DIAGNOSIS — R140 Abdominal distension (gaseous): Secondary | ICD-10-CM | POA: Diagnosis not present

## 2021-03-10 DIAGNOSIS — F5082 Avoidant/restrictive food intake disorder: Secondary | ICD-10-CM | POA: Diagnosis not present

## 2021-03-10 DIAGNOSIS — E46 Unspecified protein-calorie malnutrition: Secondary | ICD-10-CM | POA: Diagnosis not present

## 2021-03-10 DIAGNOSIS — F411 Generalized anxiety disorder: Secondary | ICD-10-CM | POA: Diagnosis not present

## 2021-03-10 DIAGNOSIS — F5089 Other specified eating disorder: Secondary | ICD-10-CM | POA: Diagnosis not present

## 2021-03-10 DIAGNOSIS — K5900 Constipation, unspecified: Secondary | ICD-10-CM | POA: Diagnosis not present

## 2021-03-11 DIAGNOSIS — F5082 Avoidant/restrictive food intake disorder: Secondary | ICD-10-CM | POA: Diagnosis not present

## 2021-03-11 DIAGNOSIS — R140 Abdominal distension (gaseous): Secondary | ICD-10-CM | POA: Diagnosis not present

## 2021-03-11 DIAGNOSIS — F411 Generalized anxiety disorder: Secondary | ICD-10-CM | POA: Diagnosis not present

## 2021-03-11 DIAGNOSIS — F5089 Other specified eating disorder: Secondary | ICD-10-CM | POA: Diagnosis not present

## 2021-03-13 DIAGNOSIS — R140 Abdominal distension (gaseous): Secondary | ICD-10-CM | POA: Diagnosis not present

## 2021-03-13 DIAGNOSIS — F5082 Avoidant/restrictive food intake disorder: Secondary | ICD-10-CM | POA: Diagnosis not present

## 2021-03-13 DIAGNOSIS — F411 Generalized anxiety disorder: Secondary | ICD-10-CM | POA: Diagnosis not present

## 2021-03-13 DIAGNOSIS — F5089 Other specified eating disorder: Secondary | ICD-10-CM | POA: Diagnosis not present

## 2021-03-14 DIAGNOSIS — R140 Abdominal distension (gaseous): Secondary | ICD-10-CM | POA: Diagnosis not present

## 2021-03-14 DIAGNOSIS — F411 Generalized anxiety disorder: Secondary | ICD-10-CM | POA: Diagnosis not present

## 2021-03-14 DIAGNOSIS — F5089 Other specified eating disorder: Secondary | ICD-10-CM | POA: Diagnosis not present

## 2021-03-14 DIAGNOSIS — F5082 Avoidant/restrictive food intake disorder: Secondary | ICD-10-CM | POA: Diagnosis not present

## 2021-03-17 DIAGNOSIS — F5089 Other specified eating disorder: Secondary | ICD-10-CM | POA: Diagnosis not present

## 2021-03-17 DIAGNOSIS — F5082 Avoidant/restrictive food intake disorder: Secondary | ICD-10-CM | POA: Diagnosis not present

## 2021-03-17 DIAGNOSIS — F411 Generalized anxiety disorder: Secondary | ICD-10-CM | POA: Diagnosis not present

## 2021-03-17 DIAGNOSIS — R140 Abdominal distension (gaseous): Secondary | ICD-10-CM | POA: Diagnosis not present

## 2021-03-18 DIAGNOSIS — R140 Abdominal distension (gaseous): Secondary | ICD-10-CM | POA: Diagnosis not present

## 2021-03-18 DIAGNOSIS — F411 Generalized anxiety disorder: Secondary | ICD-10-CM | POA: Diagnosis not present

## 2021-03-18 DIAGNOSIS — F5089 Other specified eating disorder: Secondary | ICD-10-CM | POA: Diagnosis not present

## 2021-03-18 DIAGNOSIS — F5082 Avoidant/restrictive food intake disorder: Secondary | ICD-10-CM | POA: Diagnosis not present

## 2021-03-18 DIAGNOSIS — R61 Generalized hyperhidrosis: Secondary | ICD-10-CM | POA: Diagnosis not present

## 2021-03-19 DIAGNOSIS — F411 Generalized anxiety disorder: Secondary | ICD-10-CM | POA: Diagnosis not present

## 2021-03-19 DIAGNOSIS — R140 Abdominal distension (gaseous): Secondary | ICD-10-CM | POA: Diagnosis not present

## 2021-03-19 DIAGNOSIS — F5089 Other specified eating disorder: Secondary | ICD-10-CM | POA: Diagnosis not present

## 2021-03-19 DIAGNOSIS — F5082 Avoidant/restrictive food intake disorder: Secondary | ICD-10-CM | POA: Diagnosis not present

## 2021-03-20 DIAGNOSIS — F5089 Other specified eating disorder: Secondary | ICD-10-CM | POA: Diagnosis not present

## 2021-03-20 DIAGNOSIS — F5082 Avoidant/restrictive food intake disorder: Secondary | ICD-10-CM | POA: Diagnosis not present

## 2021-03-20 DIAGNOSIS — F411 Generalized anxiety disorder: Secondary | ICD-10-CM | POA: Diagnosis not present

## 2021-03-20 DIAGNOSIS — R140 Abdominal distension (gaseous): Secondary | ICD-10-CM | POA: Diagnosis not present

## 2021-03-24 DIAGNOSIS — F5089 Other specified eating disorder: Secondary | ICD-10-CM | POA: Diagnosis not present

## 2021-03-24 DIAGNOSIS — R140 Abdominal distension (gaseous): Secondary | ICD-10-CM | POA: Diagnosis not present

## 2021-03-24 DIAGNOSIS — F5082 Avoidant/restrictive food intake disorder: Secondary | ICD-10-CM | POA: Diagnosis not present

## 2021-03-24 DIAGNOSIS — F411 Generalized anxiety disorder: Secondary | ICD-10-CM | POA: Diagnosis not present

## 2021-03-26 DIAGNOSIS — F411 Generalized anxiety disorder: Secondary | ICD-10-CM | POA: Diagnosis not present

## 2021-03-26 DIAGNOSIS — R140 Abdominal distension (gaseous): Secondary | ICD-10-CM | POA: Diagnosis not present

## 2021-03-26 DIAGNOSIS — F5089 Other specified eating disorder: Secondary | ICD-10-CM | POA: Diagnosis not present

## 2021-03-26 DIAGNOSIS — F5082 Avoidant/restrictive food intake disorder: Secondary | ICD-10-CM | POA: Diagnosis not present

## 2021-03-27 DIAGNOSIS — F5082 Avoidant/restrictive food intake disorder: Secondary | ICD-10-CM | POA: Diagnosis not present

## 2021-03-27 DIAGNOSIS — F5089 Other specified eating disorder: Secondary | ICD-10-CM | POA: Diagnosis not present

## 2021-03-27 DIAGNOSIS — R140 Abdominal distension (gaseous): Secondary | ICD-10-CM | POA: Diagnosis not present

## 2021-03-27 DIAGNOSIS — F411 Generalized anxiety disorder: Secondary | ICD-10-CM | POA: Diagnosis not present

## 2021-04-02 DIAGNOSIS — R140 Abdominal distension (gaseous): Secondary | ICD-10-CM | POA: Diagnosis not present

## 2021-04-02 DIAGNOSIS — F5082 Avoidant/restrictive food intake disorder: Secondary | ICD-10-CM | POA: Diagnosis not present

## 2021-04-02 DIAGNOSIS — F411 Generalized anxiety disorder: Secondary | ICD-10-CM | POA: Diagnosis not present

## 2021-04-02 DIAGNOSIS — F5089 Other specified eating disorder: Secondary | ICD-10-CM | POA: Diagnosis not present

## 2021-04-03 DIAGNOSIS — F411 Generalized anxiety disorder: Secondary | ICD-10-CM | POA: Diagnosis not present

## 2021-04-03 DIAGNOSIS — R140 Abdominal distension (gaseous): Secondary | ICD-10-CM | POA: Diagnosis not present

## 2021-04-03 DIAGNOSIS — F5089 Other specified eating disorder: Secondary | ICD-10-CM | POA: Diagnosis not present

## 2021-04-03 DIAGNOSIS — F5082 Avoidant/restrictive food intake disorder: Secondary | ICD-10-CM | POA: Diagnosis not present

## 2021-04-07 DIAGNOSIS — R140 Abdominal distension (gaseous): Secondary | ICD-10-CM | POA: Diagnosis not present

## 2021-04-07 DIAGNOSIS — F5082 Avoidant/restrictive food intake disorder: Secondary | ICD-10-CM | POA: Diagnosis not present

## 2021-04-07 DIAGNOSIS — F5089 Other specified eating disorder: Secondary | ICD-10-CM | POA: Diagnosis not present

## 2021-04-07 DIAGNOSIS — F411 Generalized anxiety disorder: Secondary | ICD-10-CM | POA: Diagnosis not present

## 2021-04-09 DIAGNOSIS — F5089 Other specified eating disorder: Secondary | ICD-10-CM | POA: Diagnosis not present

## 2021-04-09 DIAGNOSIS — R140 Abdominal distension (gaseous): Secondary | ICD-10-CM | POA: Diagnosis not present

## 2021-04-09 DIAGNOSIS — F5082 Avoidant/restrictive food intake disorder: Secondary | ICD-10-CM | POA: Diagnosis not present

## 2021-04-09 DIAGNOSIS — F411 Generalized anxiety disorder: Secondary | ICD-10-CM | POA: Diagnosis not present

## 2021-04-10 DIAGNOSIS — R140 Abdominal distension (gaseous): Secondary | ICD-10-CM | POA: Diagnosis not present

## 2021-04-10 DIAGNOSIS — F411 Generalized anxiety disorder: Secondary | ICD-10-CM | POA: Diagnosis not present

## 2021-04-10 DIAGNOSIS — F5089 Other specified eating disorder: Secondary | ICD-10-CM | POA: Diagnosis not present

## 2021-04-10 DIAGNOSIS — F5082 Avoidant/restrictive food intake disorder: Secondary | ICD-10-CM | POA: Diagnosis not present

## 2021-04-14 DIAGNOSIS — R140 Abdominal distension (gaseous): Secondary | ICD-10-CM | POA: Diagnosis not present

## 2021-04-14 DIAGNOSIS — F5082 Avoidant/restrictive food intake disorder: Secondary | ICD-10-CM | POA: Diagnosis not present

## 2021-04-14 DIAGNOSIS — F411 Generalized anxiety disorder: Secondary | ICD-10-CM | POA: Diagnosis not present

## 2021-04-14 DIAGNOSIS — F5089 Other specified eating disorder: Secondary | ICD-10-CM | POA: Diagnosis not present

## 2021-04-15 DIAGNOSIS — F5089 Other specified eating disorder: Secondary | ICD-10-CM | POA: Diagnosis not present

## 2021-04-21 DIAGNOSIS — F5001 Anorexia nervosa, restricting type: Secondary | ICD-10-CM | POA: Diagnosis not present

## 2021-04-22 DIAGNOSIS — F5089 Other specified eating disorder: Secondary | ICD-10-CM | POA: Diagnosis not present

## 2021-04-28 DIAGNOSIS — F5001 Anorexia nervosa, restricting type: Secondary | ICD-10-CM | POA: Diagnosis not present

## 2021-05-08 DIAGNOSIS — F5089 Other specified eating disorder: Secondary | ICD-10-CM | POA: Diagnosis not present

## 2021-05-12 DIAGNOSIS — F5089 Other specified eating disorder: Secondary | ICD-10-CM | POA: Diagnosis not present

## 2021-05-13 DIAGNOSIS — F5001 Anorexia nervosa, restricting type: Secondary | ICD-10-CM | POA: Diagnosis not present

## 2021-05-26 DIAGNOSIS — F5001 Anorexia nervosa, restricting type: Secondary | ICD-10-CM | POA: Diagnosis not present

## 2021-05-27 DIAGNOSIS — F5089 Other specified eating disorder: Secondary | ICD-10-CM | POA: Diagnosis not present

## 2021-05-29 ENCOUNTER — Ambulatory Visit: Payer: BC Managed Care – PPO | Admitting: Internal Medicine

## 2021-06-03 DIAGNOSIS — F5001 Anorexia nervosa, restricting type: Secondary | ICD-10-CM | POA: Diagnosis not present

## 2021-06-10 DIAGNOSIS — F5089 Other specified eating disorder: Secondary | ICD-10-CM | POA: Diagnosis not present

## 2021-06-17 DIAGNOSIS — F5001 Anorexia nervosa, restricting type: Secondary | ICD-10-CM | POA: Diagnosis not present

## 2021-06-25 DIAGNOSIS — F5089 Other specified eating disorder: Secondary | ICD-10-CM | POA: Diagnosis not present

## 2021-07-01 DIAGNOSIS — F5001 Anorexia nervosa, restricting type: Secondary | ICD-10-CM | POA: Diagnosis not present

## 2021-07-14 DIAGNOSIS — K3184 Gastroparesis: Secondary | ICD-10-CM | POA: Insufficient documentation

## 2021-07-14 DIAGNOSIS — R634 Abnormal weight loss: Secondary | ICD-10-CM | POA: Insufficient documentation

## 2021-07-14 NOTE — Progress Notes (Signed)
BP 102/63   Pulse 61   Temp 98.8 F (37.1 C) (Oral)   Ht 5' 9.8" (1.773 m)   Wt 185 lb (83.9 kg)   SpO2 98%   BMI 26.70 kg/m    Subjective:    Patient ID: Antonio Porter, male    DOB: 07-21-1981, 40 y.o.   MRN: [id number]  HPI: Antonio Porter is a 40 y.o. male  Chief Complaint  Patient presents with   New Patient (Initial Visit)    No concerns per patient    Patient presents to clinic to establish care with new PCP.  Patient reports a history of Anxiety, Obsessive tendencies (Zoloft), Gastroparesis and heart burn.  Patient states he takes the Reglan everyday QID if he does not take it he does not feel well.  Patient states that in March he went to an eating disorder program and completed the program completely in June.  His eating disorder had a lot to do with the Gastroparesis.  He see's a therapist and dietician who he was referred to by the eating disorder clinic.  He was getting the medication from the psychiatrist at the eating disorder clinic while he was trying to find a PCP.  Patient states he feels a lot better mentally after completing his program.  Denies thoughts of Suicide or feeling down, depressed, hopeless.   Patient denies a history of: Hypertension, Elevated Cholesterol, Diabetes, Thyroid problems, Depression, Neurological problems, and Abdominal problems.   Patient states he does not have any concerns to address at visit today.   Denies HA, CP, SOB, dizziness, palpitations, visual changes, and lower extremity swelling.   Active Ambulatory Problems    Diagnosis Date Noted   Weight loss 07/14/2021   Heartburn 11/13/2016   Gastroparesis 07/14/2021   Eating disorder with ongoing treatment 01/15/2021   Resolved Ambulatory Problems    Diagnosis Date Noted   No Resolved Ambulatory Problems   Past Medical History:  Diagnosis Date   Anxiety    Eating disorder    GERD (gastroesophageal reflux disease)    Past Surgical History:  Procedure Laterality Date    pilomindal cyst     TESTICLE REMOVAL Right 1996   VASECTOMY  11/2020   Family History  Problem Relation Age of Onset   Hypertension Mother    Hypertension Father    Obesity Father    Obesity Brother    Hypertension Brother    Cancer Paternal Grandmother       Review of Systems  Gastrointestinal:        Heart burn.   Per HPI unless specifically indicated above     Objective:    BP 102/63   Pulse 61   Temp 98.8 F (37.1 C) (Oral)   Ht 5' 9.8" (1.773 m)   Wt 185 lb (83.9 kg)   SpO2 98%   BMI 26.70 kg/m   Wt Readings from Last 3 Encounters:  07/15/21 185 lb (83.9 kg)  11/15/20 150 lb (68 kg)  10/02/20 150 lb (68 kg)    Physical Exam Vitals and nursing note reviewed.  Constitutional:      General: He is not in acute distress.    Appearance: Normal appearance. He is not ill-appearing, toxic-appearing or diaphoretic.  HENT:     Head: Normocephalic.     Right Ear: External ear normal.     Left Ear: External ear normal.     Nose: Nose normal. No congestion or rhinorrhea.     Mouth/Throat:  Mouth: Mucous membranes are moist.  Eyes:     General:        Right eye: No discharge.        Left eye: No discharge.     Extraocular Movements: Extraocular movements intact.     Conjunctiva/sclera: Conjunctivae normal.     Pupils: Pupils are equal, round, and reactive to light.  Cardiovascular:     Rate and Rhythm: Normal rate and regular rhythm.     Heart sounds: No murmur heard. Pulmonary:     Effort: Pulmonary effort is normal. No respiratory distress.     Breath sounds: Normal breath sounds. No wheezing, rhonchi or rales.  Abdominal:     General: Abdomen is flat. Bowel sounds are normal.  Musculoskeletal:     Cervical back: Normal range of motion and neck supple.  Skin:    General: Skin is warm and dry.     Capillary Refill: Capillary refill takes less than 2 seconds.  Neurological:     General: No focal deficit present.     Mental Status: He is alert and  oriented to person, place, and time.  Psychiatric:        Mood and Affect: Mood normal.        Behavior: Behavior normal.        Thought Content: Thought content normal.        Judgment: Judgment normal.    No results found for this or any previous visit.    Assessment & Plan:   Problem List Items Addressed This Visit       Digestive   Gastroparesis - Primary    Chronic. Well controlled on current medication regimen of Reglan 5mg  QID. Refills sent today.  Patient follows up with out patient therapist and Nutritionist that he was referred to after completing his eating disorder treatment.          Other   Heartburn    Chronic.  Controlled with Aciphex 20mg  daily.  Refills sent to the pharmacy.      Eating disorder with ongoing treatment    Resolved.  Continues to follow up with Therapist and Nutritionist to help control patient's symptoms.  Needs refills on medications.  Denies needing referral to psychiatrist.       Other Visit Diagnoses     Encounter to establish care       Need for influenza vaccination       Relevant Orders   Flu Vaccine QUAD 90mo+IM (Fluarix, Fluzone & Alfiuria Quad PF) (Completed)        Follow up plan: Return in about 1 month (around 08/14/2021) for Physical and Fasting labs.

## 2021-07-15 ENCOUNTER — Encounter: Payer: Self-pay | Admitting: Nurse Practitioner

## 2021-07-15 ENCOUNTER — Ambulatory Visit: Payer: BC Managed Care – PPO | Admitting: Nurse Practitioner

## 2021-07-15 ENCOUNTER — Other Ambulatory Visit: Payer: Self-pay

## 2021-07-15 VITALS — BP 102/63 | HR 61 | Temp 98.8°F | Ht 69.8 in | Wt 185.0 lb

## 2021-07-15 DIAGNOSIS — K3184 Gastroparesis: Secondary | ICD-10-CM | POA: Diagnosis not present

## 2021-07-15 DIAGNOSIS — Z7689 Persons encountering health services in other specified circumstances: Secondary | ICD-10-CM | POA: Diagnosis not present

## 2021-07-15 DIAGNOSIS — R12 Heartburn: Secondary | ICD-10-CM

## 2021-07-15 DIAGNOSIS — F509 Eating disorder, unspecified: Secondary | ICD-10-CM | POA: Diagnosis not present

## 2021-07-15 DIAGNOSIS — Z23 Encounter for immunization: Secondary | ICD-10-CM

## 2021-07-15 MED ORDER — SERTRALINE HCL 50 MG PO TABS: 50.0000 mg | ORAL_TABLET | Freq: Every morning | ORAL | 1 refills | Status: DC

## 2021-07-15 MED ORDER — RABEPRAZOLE SODIUM 20 MG PO TBEC: 20.0000 mg | DELAYED_RELEASE_TABLET | Freq: Every day | ORAL | 1 refills | Status: DC

## 2021-07-15 MED ORDER — METOCLOPRAMIDE HCL 5 MG PO TABS: 5.0000 mg | ORAL_TABLET | Freq: Four times a day (QID) | ORAL | 1 refills | Status: DC

## 2021-07-15 NOTE — Assessment & Plan Note (Signed)
Chronic. Well controlled on current medication regimen of Reglan 5mg  QID. Refills sent today.  Patient follows up with out patient therapist and Nutritionist that he was referred to after completing his eating disorder treatment.

## 2021-07-15 NOTE — Assessment & Plan Note (Signed)
Resolved.  Continues to follow up with Therapist and Nutritionist to help control patient's symptoms.  Needs refills on medications.  Denies needing referral to psychiatrist.

## 2021-07-15 NOTE — Assessment & Plan Note (Signed)
Chronic.  Controlled with Aciphex 20mg  daily.  Refills sent to the pharmacy.

## 2021-07-16 DIAGNOSIS — F5089 Other specified eating disorder: Secondary | ICD-10-CM | POA: Diagnosis not present

## 2021-07-23 DIAGNOSIS — F5001 Anorexia nervosa, restricting type: Secondary | ICD-10-CM | POA: Diagnosis not present

## 2021-08-19 ENCOUNTER — Encounter: Payer: Self-pay | Admitting: Nurse Practitioner

## 2021-08-19 ENCOUNTER — Ambulatory Visit (INDEPENDENT_AMBULATORY_CARE_PROVIDER_SITE_OTHER): Payer: BC Managed Care – PPO | Admitting: Nurse Practitioner

## 2021-08-19 ENCOUNTER — Other Ambulatory Visit: Payer: Self-pay

## 2021-08-19 VITALS — BP 104/67 | HR 51 | Temp 98.4°F | Ht 70.2 in | Wt 188.4 lb

## 2021-08-19 DIAGNOSIS — Z114 Encounter for screening for human immunodeficiency virus [HIV]: Secondary | ICD-10-CM

## 2021-08-19 DIAGNOSIS — Z1159 Encounter for screening for other viral diseases: Secondary | ICD-10-CM

## 2021-08-19 DIAGNOSIS — R12 Heartburn: Secondary | ICD-10-CM

## 2021-08-19 DIAGNOSIS — Z0000 Encounter for general adult medical examination without abnormal findings: Secondary | ICD-10-CM | POA: Diagnosis not present

## 2021-08-19 DIAGNOSIS — K3184 Gastroparesis: Secondary | ICD-10-CM | POA: Diagnosis not present

## 2021-08-19 DIAGNOSIS — F509 Eating disorder, unspecified: Secondary | ICD-10-CM | POA: Diagnosis not present

## 2021-08-19 LAB — URINALYSIS, ROUTINE W REFLEX MICROSCOPIC
Bilirubin, UA: NEGATIVE
Glucose, UA: NEGATIVE
Ketones, UA: NEGATIVE
Leukocytes,UA: NEGATIVE
Nitrite, UA: NEGATIVE
Protein,UA: NEGATIVE
RBC, UA: NEGATIVE
Specific Gravity, UA: 1.02 (ref 1.005–1.030)
Urobilinogen, Ur: 0.2 mg/dL (ref 0.2–1.0)
pH, UA: 7 (ref 5.0–7.5)

## 2021-08-19 NOTE — Assessment & Plan Note (Signed)
Resolved.  Has completed outpatient therapy.  Maintaining on his own at this point.  Feels like things are going well.  Will reassess at next visit.  Follow up in 6 months.

## 2021-08-19 NOTE — Assessment & Plan Note (Signed)
Chronic. Well controlled on current medication regimen of Reglan 5mg  QID.  Patient had completed therapy with nutritionist and therapist.  Feels like he is doing well on his own managing symptoms.

## 2021-08-19 NOTE — Assessment & Plan Note (Signed)
Chronic.  Controlled with Aciphex 20mg  daily.  Well controlled at visit today. Follow up in 6 months for reevaluation.

## 2021-08-19 NOTE — Progress Notes (Signed)
BP 104/67   Pulse (!) 51   Temp 98.4 F (36.9 C) (Oral)   Ht 5' 10.2" (1.783 m)   Wt 188 lb 6.4 oz (85.5 kg)   SpO2 98%   BMI 26.88 kg/m    Subjective:    Patient ID: Antonio Porter., male    DOB: [birth year], 40 y.o.   MRN: [id number]  HPI: Antonio Porter. is a 40 y.o. male presenting on 08/19/2021 for comprehensive medical examination. Current medical complaints include:none  He currently lives with: Interim Problems from his last visit: no  GERD GERD control status: controlled Satisfied with current treatment? yes Heartburn frequency: daily Medication side effects: no  Medication compliance: stable Can tell when he misses a dose of the Reglan but feels like symptoms are well controlled when he is taking it.  Patient states he has wrapped up therapy for his eating disorder.  Now managing it on his own.    Denies HA, CP, SOB, dizziness, palpitations, visual changes, and lower extremity swelling.   Depression Screen done today and results listed below:  Depression screen [facility] 2/9 08/19/2021 07/15/2021  Decreased Interest 0 0  Down, Depressed, Hopeless 0 0  PHQ - 2 Score 0 0  Altered sleeping 0 0  Tired, decreased energy 0 0  Change in appetite 0 0  Feeling bad or failure about yourself  0 0  Trouble concentrating 0 0  Moving slowly or fidgety/restless 0 0  Suicidal thoughts 0 0  PHQ-9 Score 0 0  Difficult doing work/chores Not difficult at all Not difficult at all    The patient does not have a history of falls. I did complete a risk assessment for falls. A plan of care for falls was documented.   Past Medical History:  Past Medical History:  Diagnosis Date   Anxiety    Eating disorder    GERD (gastroesophageal reflux disease)     Surgical History:  Past Surgical History:  Procedure Laterality Date   pilomindal cyst     TESTICLE REMOVAL Right 1996   VASECTOMY  11/2020    Medications:  Current Outpatient Medications on File  Prior to Visit  Medication Sig   metoCLOPramide (REGLAN) 5 MG tablet Take 1 tablet (5 mg total) by mouth in the morning, at noon, in the evening, and at bedtime.   RABEprazole (ACIPHEX) 20 MG tablet Take 1 tablet (20 mg total) by mouth daily.   sertraline (ZOLOFT) 50 MG tablet Take 1 tablet (50 mg total) by mouth every morning.   No current facility-administered medications on file prior to visit.    Allergies:  Allergies  Allergen Reactions   Other Hives    IV DYE-HIVES IN ARMPITS    Social History:  Social History   Socioeconomic History   Marital status: Married    Spouse name: Not on file   Number of children: Not on file   Years of education: Not on file   Highest education level: Not on file  Occupational History   Not on file  Tobacco Use   Smoking status: Never   Smokeless tobacco: Never  Vaping Use   Vaping Use: Never used  Substance and Sexual Activity   Alcohol use: Yes    Alcohol/week: 8.0 standard drinks    Types: 8 Cans of beer per week   Drug use: Never   Sexual activity: Yes  Other Topics Concern   Not on file  Social History Narrative   Not on  file   Social Determinants of Health   Financial Resource Strain: Not on file  Food Insecurity: Not on file  Transportation Needs: Not on file  Physical Activity: Not on file  Stress: Not on file  Social Connections: Not on file  Intimate Partner Violence: Not on file   Social History   Tobacco Use  Smoking Status Never  Smokeless Tobacco Never   Social History   Substance and Sexual Activity  Alcohol Use Yes   Alcohol/week: 8.0 standard drinks   Types: 8 Cans of beer per week    Family History:  Family History  Problem Relation Age of Onset   Hypertension Mother    Hypertension Father    Obesity Father    Obesity Brother    Hypertension Brother    Cancer Paternal Grandmother     Past medical history, surgical history, medications, allergies, family history and social history  reviewed with patient today and changes made to appropriate areas of the chart.   Review of Systems  Constitutional:  Negative for weight loss.  Eyes:  Negative for blurred vision and double vision.  Respiratory:  Negative for shortness of breath.   Cardiovascular:  Negative for chest pain, palpitations and leg swelling.  Gastrointestinal:  Positive for heartburn.  Neurological:  Negative for dizziness and headaches.  All other ROS negative except what is listed above and in the HPI.      Objective:    BP 104/67   Pulse (!) 51   Temp 98.4 F (36.9 C) (Oral)   Ht 5' 10.2" (1.783 m)   Wt 188 lb 6.4 oz (85.5 kg)   SpO2 98%   BMI 26.88 kg/m   Wt Readings from Last 3 Encounters:  08/19/21 188 lb 6.4 oz (85.5 kg)  07/15/21 185 lb (83.9 kg)  11/15/20 150 lb (68 kg)    Physical Exam Vitals and nursing note reviewed.  Constitutional:      General: He is not in acute distress.    Appearance: Normal appearance. He is not ill-appearing, toxic-appearing or diaphoretic.  HENT:     Head: Normocephalic.     Right Ear: Tympanic membrane, ear canal and external ear normal.     Left Ear: Tympanic membrane, ear canal and external ear normal.     Nose: Nose normal. No congestion or rhinorrhea.     Mouth/Throat:     Mouth: Mucous membranes are moist.  Eyes:     General:        Right eye: No discharge.        Left eye: No discharge.     Extraocular Movements: Extraocular movements intact.     Conjunctiva/sclera: Conjunctivae normal.     Pupils: Pupils are equal, round, and reactive to light.  Cardiovascular:     Rate and Rhythm: Normal rate and regular rhythm.     Heart sounds: No murmur heard. Pulmonary:     Effort: Pulmonary effort is normal. No respiratory distress.     Breath sounds: Normal breath sounds. No wheezing, rhonchi or rales.  Abdominal:     General: Abdomen is flat. Bowel sounds are normal. There is no distension.     Palpations: Abdomen is soft.     Tenderness: There  is no abdominal tenderness. There is no guarding.  Musculoskeletal:     Cervical back: Normal range of motion and neck supple.  Skin:    General: Skin is warm and dry.     Capillary Refill: Capillary refill takes less  than 2 seconds.  Neurological:     General: No focal deficit present.     Mental Status: He is alert and oriented to person, place, and time.     Cranial Nerves: No cranial nerve deficit.     Motor: No weakness.     Deep Tendon Reflexes: Reflexes normal.  Psychiatric:        Mood and Affect: Mood normal.        Behavior: Behavior normal.        Thought Content: Thought content normal.        Judgment: Judgment normal.    No results found for this or any previous visit.    Assessment & Plan:   Problem List Items Addressed This Visit       Digestive   Gastroparesis     Other   Heartburn   Eating disorder with ongoing treatment   Other Visit Diagnoses     Annual physical exam    -  Primary   Screening for HIV (human immunodeficiency virus)       Encounter for hepatitis C screening test for low risk patient            Discussed aspirin prophylaxis for myocardial infarction prevention and decision was it was not indicated  LABORATORY TESTING:  Health maintenance labs ordered today as discussed above.    IMMUNIZATIONS:   - Tdap: Tetanus vaccination status reviewed: last tetanus booster within 10 years. - Influenza: Up to date - Pneumovax: Not applicable - Prevnar: Not applicable - HPV:  discussed at visit today. - Zostavax vaccine: Not applicable  SCREENING: - Colonoscopy: Not applicable  Discussed with patient purpose of the colonoscopy is to detect colon cancer at curable precancerous or early stages   - AAA Screening: Not applicable  -Hearing Test: Not applicable  -Spirometry: Not applicable   PATIENT COUNSELING:    Sexuality: Discussed sexually transmitted diseases, partner selection, use of condoms, avoidance of unintended pregnancy  and  contraceptive alternatives.   Advised to avoid cigarette smoking.  I discussed with the patient that most people either abstain from alcohol or drink within safe limits (<=14/week and <=4 drinks/occasion for males, <=7/weeks and <= 3 drinks/occasion for females) and that the risk for alcohol disorders and other health effects rises proportionally with the number of drinks per week and how often a drinker exceeds daily limits.  Discussed cessation/primary prevention of drug use and availability of treatment for abuse.   Diet: Encouraged to adjust caloric intake to maintain  or achieve ideal body weight, to reduce intake of dietary saturated fat and total fat, to limit sodium intake by avoiding high sodium foods and not adding table salt, and to maintain adequate dietary potassium and calcium preferably from fresh fruits, vegetables, and low-fat dairy products.    stressed the importance of regular exercise  Injury prevention: Discussed safety belts, safety helmets, smoke detector, smoking near bedding or upholstery.   Dental health: Discussed importance of regular tooth brushing, flossing, and dental visits.   Follow up plan: NEXT PREVENTATIVE PHYSICAL DUE IN 1 YEAR. No follow-ups on file.

## 2021-08-20 ENCOUNTER — Telehealth: Payer: Self-pay

## 2021-08-20 LAB — CBC WITH DIFFERENTIAL/PLATELET
Basophils Absolute: 0 10*3/uL (ref 0.0–0.2)
Basos: 0 %
EOS (ABSOLUTE): 0.2 10*3/uL (ref 0.0–0.4)
Eos: 3 %
Hematocrit: 44.6 % (ref 37.5–51.0)
Hemoglobin: 14.7 g/dL (ref 13.0–17.7)
Immature Grans (Abs): 0 10*3/uL (ref 0.0–0.1)
Immature Granulocytes: 0 %
Lymphocytes Absolute: 1.4 10*3/uL (ref 0.7–3.1)
Lymphs: 19 %
MCH: 29 pg (ref 26.6–33.0)
MCHC: 33 g/dL (ref 31.5–35.7)
MCV: 88 fL (ref 79–97)
Monocytes Absolute: 0.5 10*3/uL (ref 0.1–0.9)
Monocytes: 6 %
Neutrophils Absolute: 5 10*3/uL (ref 1.4–7.0)
Neutrophils: 72 %
Platelets: 235 10*3/uL (ref 150–450)
RBC: 5.07 x10E6/uL (ref 4.14–5.80)
RDW: 13.2 % (ref 11.6–15.4)
WBC: 7.2 10*3/uL (ref 3.4–10.8)

## 2021-08-20 LAB — LIPID PANEL
Chol/HDL Ratio: 2.7 ratio (ref 0.0–5.0)
Cholesterol, Total: 170 mg/dL (ref 100–199)
HDL: 63 mg/dL (ref >39)
LDL Chol Calc (NIH): 93 mg/dL (ref 0–99)
Triglycerides: 75 mg/dL (ref 0–149)
VLDL Cholesterol Cal: 14 mg/dL (ref 5–40)

## 2021-08-20 LAB — TSH: TSH: 1.75 u[IU]/mL (ref 0.450–4.500)

## 2021-08-20 LAB — COMPREHENSIVE METABOLIC PANEL
ALT: 7 IU/L (ref 0–44)
AST: 17 IU/L (ref 0–40)
Albumin/Globulin Ratio: 2.1 (ref 1.2–2.2)
Albumin: 4.9 g/dL (ref 4.0–5.0)
Alkaline Phosphatase: 63 IU/L (ref 44–121)
BUN/Creatinine Ratio: 18 (ref 9–20)
BUN: 19 mg/dL (ref 6–20)
Bilirubin Total: 0.8 mg/dL (ref 0.0–1.2)
CO2: 24 mmol/L (ref 20–29)
Calcium: 9.7 mg/dL (ref 8.7–10.2)
Chloride: 101 mmol/L (ref 96–106)
Creatinine, Ser: 1.03 mg/dL (ref 0.76–1.27)
Globulin, Total: 2.3 g/dL (ref 1.5–4.5)
Glucose: 81 mg/dL (ref 70–99)
Potassium: 4.3 mmol/L (ref 3.5–5.2)
Sodium: 140 mmol/L (ref 134–144)
Total Protein: 7.2 g/dL (ref 6.0–8.5)
eGFR: 95 mL/min/{1.73_m2} (ref >59)

## 2021-08-20 LAB — HIV ANTIBODY (ROUTINE TESTING W REFLEX): HIV Screen 4th Generation wRfx: NONREACTIVE

## 2021-08-20 LAB — HEPATITIS C ANTIBODY: Hep C Virus Ab: <0.1 s/co ratio (ref 0.0–0.9)

## 2021-08-20 NOTE — Telephone Encounter (Signed)
-----   Message from Larae Grooms, NP sent at 08/20/2021  9:29 AM EDT ----- Please let patient know that his lab work looks great.  No concerns with cholesterol, liver, kidneys or electrolytes.  Please follow up as discussed.  We will continue to monitor these in the future.

## 2021-08-20 NOTE — Telephone Encounter (Signed)
Attempted to contact patient regarding results but no answer or voicemail available.  [facility] may give results.

## 2021-08-20 NOTE — Progress Notes (Signed)
Please let patient know that his lab work looks great.  No concerns with cholesterol, liver, kidneys or electrolytes.  Please follow up as discussed.  We will continue to monitor these in the future.

## 2021-11-17 ENCOUNTER — Telehealth: Payer: Self-pay

## 2021-11-17 NOTE — Telephone Encounter (Signed)
Copied from CRM [id number]. Topic: Complaint - Billing/Coding >> Oct 15, 2021 12:31 PM Crist Infante wrote: Date of Incident: 07/20/2021 Details of complaint: pt was told his insurance di not pay for the flu vaccine, and he paid out of poscket.  Since then he spoke w/ [employer] who said they didpy for the vaccine, and sent the money in for that. How would the patient like to see it resolved? He would like the money back he paid infor the flu vaccine   Route to Research officer, political party. >> Nov 13, 2021  4:25 PM Pawlus, Maxine Glenn A wrote: Pt called to follow up on this billing error, pt requested a call back from the practice admin. Please advise.

## 2021-11-20 ENCOUNTER — Other Ambulatory Visit: Payer: Self-pay

## 2021-11-20 DIAGNOSIS — Z302 Encounter for sterilization: Secondary | ICD-10-CM

## 2021-11-21 ENCOUNTER — Other Ambulatory Visit: Payer: Self-pay

## 2021-11-21 ENCOUNTER — Other Ambulatory Visit: Payer: BC Managed Care – PPO

## 2021-11-21 DIAGNOSIS — Z302 Encounter for sterilization: Secondary | ICD-10-CM | POA: Diagnosis not present

## 2021-11-22 LAB — POST-VAS SPERM EVALUATION,QUAL: Volume: 1.5 mL

## 2022-01-05 ENCOUNTER — Other Ambulatory Visit: Payer: Self-pay | Admitting: Nurse Practitioner

## 2022-01-06 NOTE — Telephone Encounter (Signed)
Requested Prescriptions  ?Pending Prescriptions Disp Refills  ?? sertraline (ZOLOFT) 50 MG tablet [Pharmacy Med Name: SERTRALINE HCL 50 MG TABLET] 90 tablet 1  ?  Sig: TAKE 1 TABLET BY MOUTH EVERY MORNING  ?  ? Not Delegated - Psychiatry:  Antidepressants - SSRI - sertraline Failed - 01/05/2022  1:44 AM  ?  ?  Failed - This refill cannot be delegated  ?  ?  Passed - AST in normal range and within 360 days  ?  AST  ?Date Value Ref Range Status  ?08/19/2021 17 0 - 40 IU/L Final  ?   ?  ?  Passed - ALT in normal range and within 360 days  ?  ALT  ?Date Value Ref Range Status  ?08/19/2021 7 0 - 44 IU/L Final  ?   ?  ?  Passed - Completed PHQ-2 or PHQ-9 in the last 360 days  ?  ?  Passed - Valid encounter within last 6 months  ?  Recent Outpatient Visits   ?      ? 4 months ago Annual physical exam  ? [facility] Larae Grooms, NP  ? 5 months ago Gastroparesis  ? [facility] Larae Grooms, NP  ?  ?  ?Future Appointments   ?        ? In 1 month Larae Grooms, NP [facility], [facility]  ?  ? ?  ?  ?  ?? RABEprazole (ACIPHEX) 20 MG tablet [Pharmacy Med Name: RABEPRAZOLE SOD DR 20 MG TAB] 90 tablet 1  ?  Sig: TAKE 1 TABLET BY MOUTH EVERY DAY  ?  ? Gastroenterology: Proton Pump Inhibitors Passed - 01/05/2022  1:44 AM  ?  ?  Passed - Valid encounter within last 12 months  ?  Recent Outpatient Visits   ?      ? 4 months ago Annual physical exam  ? [facility] Larae Grooms, NP  ? 5 months ago Gastroparesis  ? [facility] Larae Grooms, NP  ?  ?  ?Future Appointments   ?        ? In 1 month Larae Grooms, NP [facility], [facility]  ?  ? ?  ?  ?  ?? metoCLOPramide (REGLAN) 5 MG tablet [Pharmacy Med Name: METOCLOPRAMIDE 5 MG TABLET] 360 tablet 1  ?  Sig: TAKE 1 TABLET (5 MG TOTAL) BY MOUTH IN THE MORNING, AT NOON, IN THE EVENING, AND AT BEDTIME.  ?  ? Not Delegated - Gastroenterology: Antiemetics - metoclopramide Failed - 01/05/2022  1:44 AM  ?  ?   Failed - This refill cannot be delegated  ?  ?  Passed - Cr in normal range and within 360 days  ?  Creatinine, Ser  ?Date Value Ref Range Status  ?08/19/2021 1.03 0.76 - 1.27 mg/dL Final  ?   ?  ?  Passed - Valid encounter within last 6 months  ?  Recent Outpatient Visits   ?      ? 4 months ago Annual physical exam  ? [facility] Larae Grooms, NP  ? 5 months ago Gastroparesis  ? [facility] Larae Grooms, NP  ?  ?  ?Future Appointments   ?        ? In 1 month Larae Grooms, NP [facility], [facility]  ?  ? ?  ?  ?  ? ? ?

## 2022-01-06 NOTE — Telephone Encounter (Signed)
Requested medication (s) are due for refill today: yes ? ?Requested medication (s) are on the active medication list: yes ? ?Last refill:  Zoloft 9/13 #90 with 1 RF, Reglan 07/15/21 #360 with 1 RF ? ?Future visit scheduled: 02/17/22 ? ?Notes to clinic:  This medication can not be delegated, please assess.  ? ? ? ?  ? ?Requested Prescriptions  ?Pending Prescriptions Disp Refills  ? sertraline (ZOLOFT) 50 MG tablet [Pharmacy Med Name: SERTRALINE HCL 50 MG TABLET] 90 tablet 1  ?  Sig: TAKE 1 TABLET BY MOUTH EVERY MORNING  ?  ? Not Delegated - Psychiatry:  Antidepressants - SSRI - sertraline Failed - 01/05/2022  1:44 AM  ?  ?  Failed - This refill cannot be delegated  ?  ?  Passed - AST in normal range and within 360 days  ?  AST  ?Date Value Ref Range Status  ?08/19/2021 17 0 - 40 IU/L Final  ?  ?  ?  ?  Passed - ALT in normal range and within 360 days  ?  ALT  ?Date Value Ref Range Status  ?08/19/2021 7 0 - 44 IU/L Final  ?  ?  ?  ?  Passed - Completed PHQ-2 or PHQ-9 in the last 360 days  ?  ?  Passed - Valid encounter within last 6 months  ?  Recent Outpatient Visits   ? ?      ? 4 months ago Annual physical exam  ? [facility] Larae Grooms, NP  ? 5 months ago Gastroparesis  ? [facility] Larae Grooms, NP  ? ?  ?  ?Future Appointments   ? ?        ? In 1 month Larae Grooms, NP [facility], [facility]  ? ?  ? ?  ?  ?  ? metoCLOPramide (REGLAN) 5 MG tablet [Pharmacy Med Name: METOCLOPRAMIDE 5 MG TABLET] 360 tablet 1  ?  Sig: TAKE 1 TABLET (5 MG TOTAL) BY MOUTH IN THE MORNING, AT NOON, IN THE EVENING, AND AT BEDTIME.  ?  ? Not Delegated - Gastroenterology: Antiemetics - metoclopramide Failed - 01/05/2022  1:44 AM  ?  ?  Failed - This refill cannot be delegated  ?  ?  Passed - Cr in normal range and within 360 days  ?  Creatinine, Ser  ?Date Value Ref Range Status  ?08/19/2021 1.03 0.76 - 1.27 mg/dL Final  ?  ?  ?  ?  Passed - Valid encounter within last 6 months  ?  Recent  Outpatient Visits   ? ?      ? 4 months ago Annual physical exam  ? [facility] Larae Grooms, NP  ? 5 months ago Gastroparesis  ? [facility] Larae Grooms, NP  ? ?  ?  ?Future Appointments   ? ?        ? In 1 month Larae Grooms, NP [facility], [facility]  ? ?  ? ?  ?  ?  ?Signed Prescriptions Disp Refills  ? RABEprazole (ACIPHEX) 20 MG tablet 90 tablet 1  ?  Sig: TAKE 1 TABLET BY MOUTH EVERY DAY  ?  ? Gastroenterology: Proton Pump Inhibitors Passed - 01/05/2022  1:44 AM  ?  ?  Passed - Valid encounter within last 12 months  ?  Recent Outpatient Visits   ? ?      ? 4 months ago Annual physical exam  ? [facility] Larae Grooms, NP  ? 5 months ago Gastroparesis  ? [facility] Larae Grooms, NP  ? ?  ?  ?Future Appointments   ? ?        ? In 1 month Larae Grooms, NP [facility], [facility]  ? ?  ? ?  ?  ?  ? ? ?

## 2022-01-07 ENCOUNTER — Other Ambulatory Visit: Payer: Self-pay

## 2022-01-07 ENCOUNTER — Encounter: Payer: Self-pay | Admitting: Internal Medicine

## 2022-01-07 ENCOUNTER — Ambulatory Visit: Payer: BC Managed Care – PPO | Admitting: Internal Medicine

## 2022-01-07 VITALS — BP 131/73 | HR 55 | Temp 98.3°F | Wt 182.2 lb

## 2022-01-07 DIAGNOSIS — J029 Acute pharyngitis, unspecified: Secondary | ICD-10-CM | POA: Diagnosis not present

## 2022-01-07 NOTE — Progress Notes (Signed)
? ?BP 131/73   Pulse (!) 55   Temp 98.3 ?F (36.8 ?C)   Wt 182 lb 3.2 oz (82.6 kg)   SpO2 99%   BMI 25.99 kg/m?   ? ?Subjective:  ? ? Patient ID: Antonio Porter., male    DOB: [birth year], 41 y.o.   MRN: [id number] ? ?Chief Complaint  ?Patient presents with  ? Headache  ?  Patient states he had a headache yesterday morning and felt run down. Patient kids and wife are positive for strep but he does not have any sore throat.   ? ? ?HPI: ?Antonio Porter. is a 41 y.o. male ? ?Headache  ?This is a new problem. The current episode started yesterday. The problem occurs intermittently. The pain is located in the Frontal region. Pertinent negatives include no abdominal pain, abnormal behavior, anorexia, back pain, blurred vision, coughing, dizziness, drainage, facial sweating, fever, hearing loss, loss of balance, muscle aches, nausea, scalp tenderness, seizures, sinus pressure, sore throat, swollen glands, tingling or tinnitus.  ? ?Chief Complaint  ?Patient presents with  ? Headache  ?  Patient states he had a headache yesterday morning and felt run down. Patient kids and wife are positive for strep but he does not have any sore throat.   ? ? ?Relevant past medical, surgical, family and social history reviewed and updated as indicated. Interim medical history since our last visit reviewed. ?Allergies and medications reviewed and updated. ? ?Review of Systems  ?Constitutional:  Negative for fever.  ?HENT:  Negative for hearing loss, sinus pressure, sore throat and tinnitus.   ?Eyes:  Negative for blurred vision.  ?Respiratory:  Negative for cough.   ?Gastrointestinal:  Negative for abdominal pain, anorexia and nausea.  ?Musculoskeletal:  Negative for back pain.  ?Neurological:  Positive for headaches. Negative for dizziness, tingling, seizures and loss of balance.  ? ?Per HPI unless specifically indicated above ? ?   ?Objective:  ?  ?BP 131/73   Pulse (!) 55   Temp 98.3 ?F (36.8 ?C)   Wt 182 lb 3.2  oz (82.6 kg)   SpO2 99%   BMI 25.99 kg/m?   ?Wt Readings from Last 3 Encounters:  ?01/07/22 182 lb 3.2 oz (82.6 kg)  ?08/19/21 188 lb 6.4 oz (85.5 kg)  ?07/15/21 185 lb (83.9 kg)  ?  ?Physical Exam ?Vitals and nursing note reviewed.  ?Constitutional:   ?   General: He is not in acute distress. ?   Appearance: Normal appearance. He is not ill-appearing or diaphoretic.  ?HENT:  ?   Head: Atraumatic.  ?   Right Ear: Tympanic membrane and external ear normal. There is no impacted cerumen.  ?   Left Ear: External ear normal.  ?   Nose: No congestion or rhinorrhea.  ?   Mouth/Throat:  ?   Pharynx: No oropharyngeal exudate or posterior oropharyngeal erythema.  ?Eyes:  ?   Conjunctiva/sclera: Conjunctivae normal.  ?   Pupils: Pupils are equal, round, and reactive to light.  ?Cardiovascular:  ?   Rate and Rhythm: Normal rate and regular rhythm.  ?Pulmonary:  ?   Effort: No respiratory distress.  ?   Breath sounds: No stridor. No wheezing.  ?Chest:  ?   Chest wall: No tenderness.  ?Abdominal:  ?   General: Abdomen is flat.  ?Musculoskeletal:  ?   Cervical back: Neck supple. No tenderness.  ?   Left lower leg: No edema.  ?Skin: ?   General: Skin is  warm.  ? ? ?Results for orders placed or performed in visit on 01/07/22  ?Rapid Strep screen([facility])  ? Specimen: Other  ? Other  ?Result Value Ref Range  ? Strep Gp A Ag, IA W/Reflex Negative Negative  ?Culture, Group A Strep  ? Other  ?Result Value Ref Range  ? Strep A Culture WILL FOLLOW   ? ?   ? ? ?Current Outpatient Medications:  ?  metoCLOPramide (REGLAN) 5 MG tablet, TAKE 1 TABLET (5 MG TOTAL) BY MOUTH IN THE MORNING, AT NOON, IN THE EVENING, AND AT BEDTIME., Disp: 360 tablet, Rfl: 1 ?  RABEprazole (ACIPHEX) 20 MG tablet, TAKE 1 TABLET BY MOUTH EVERY DAY, Disp: 90 tablet, Rfl: 1 ?  sertraline (ZOLOFT) 50 MG tablet, TAKE 1 TABLET BY MOUTH EVERY MORNING, Disp: 90 tablet, Rfl: 1  ? ? ?Assessment & Plan:  ?HEADACHES  ? Sec to allergic rhinitis ?Strep -ve.  ?Will need  to use otc tylenol / ibuprofen and if symptoms worsen to call office. ?To use hand hygeine and masking if exposed to sick contacts  ? ? ?Problem List Items Addressed This Visit   ? ?  ? Other  ? Sore throat - Primary  ? Relevant Orders  ? Rapid Strep screen([facility]) (Completed)  ?  ? ?Orders Placed This Encounter  ?Procedures  ? Rapid Strep screen([facility])  ? Culture, Group A Strep  ?  ? ?No orders of the defined types were placed in this encounter. ?  ? ?Follow up plan: ?No follow-ups on file. ?

## 2022-01-09 LAB — CULTURE, GROUP A STREP: Strep A Culture: NEGATIVE

## 2022-01-09 LAB — RAPID STREP SCREEN (MED CTR MEBANE ONLY): Strep Gp A Ag, IA W/Reflex: NEGATIVE

## 2022-02-16 NOTE — Progress Notes (Signed)
? ?BP 111/74   Pulse (!) 53   Temp 98.8 ?F (37.1 ?C) (Oral)   Wt 182 lb 6.4 oz (82.7 kg)   SpO2 100%   BMI 26.02 kg/m?   ? ?Subjective:  ? ? Patient ID: Antonio Brave., male    DOB: January 01, 1981, 41 y.o.   MRN: [id number] ? ?HPI: ?Antonio Porter. is a 41 y.o. male ? ?Chief Complaint  ?Patient presents with  ? Heartburn  ?  6 month f/u   ? ?EATING DISORDER ?Patient states he is doing well.  Medication is working well for him.  He has been more active recently and dropped a couple of pounds.  Denies concerns at visit today.  As long as he is taking his medication he is doing well.  The reglan really helps to control his symptoms. ? ?Relevant past medical, surgical, family and social history reviewed and updated as indicated. Interim medical history since our last visit reviewed. ?Allergies and medications reviewed and updated. ? ?Review of Systems  ?Psychiatric/Behavioral:    ?     Denies problems with eating disorder  ? ?Per HPI unless specifically indicated above ? ?   ?Objective:  ?  ?BP 111/74   Pulse (!) 53   Temp 98.8 ?F (37.1 ?C) (Oral)   Wt 182 lb 6.4 oz (82.7 kg)   SpO2 100%   BMI 26.02 kg/m?   ?Wt Readings from Last 3 Encounters:  ?02/17/22 182 lb 6.4 oz (82.7 kg)  ?01/07/22 182 lb 3.2 oz (82.6 kg)  ?08/19/21 188 lb 6.4 oz (85.5 kg)  ?  ?Physical Exam ?Vitals and nursing note reviewed.  ?Constitutional:   ?   General: He is not in acute distress. ?   Appearance: Normal appearance. He is normal weight. He is not ill-appearing, toxic-appearing or diaphoretic.  ?HENT:  ?   Head: Normocephalic.  ?   Right Ear: External ear normal.  ?   Left Ear: External ear normal.  ?   Nose: Nose normal. No congestion or rhinorrhea.  ?   Mouth/Throat:  ?   Mouth: Mucous membranes are moist.  ?Eyes:  ?   General:     ?   Right eye: No discharge.     ?   Left eye: No discharge.  ?   Extraocular Movements: Extraocular movements intact.  ?   Conjunctiva/sclera: Conjunctivae normal.  ?   Pupils: Pupils  are equal, round, and reactive to light.  ?Cardiovascular:  ?   Rate and Rhythm: Normal rate and regular rhythm.  ?   Heart sounds: No murmur heard. ?Pulmonary:  ?   Effort: Pulmonary effort is normal. No respiratory distress.  ?   Breath sounds: Normal breath sounds. No wheezing, rhonchi or rales.  ?Abdominal:  ?   General: Abdomen is flat. Bowel sounds are normal.  ?Musculoskeletal:  ?   Cervical back: Normal range of motion and neck supple.  ?Skin: ?   General: Skin is warm and dry.  ?   Capillary Refill: Capillary refill takes less than 2 seconds.  ?Neurological:  ?   General: No focal deficit present.  ?   Mental Status: He is alert and oriented to person, place, and time.  ?Psychiatric:     ?   Mood and Affect: Mood normal.     ?   Behavior: Behavior normal.     ?   Thought Content: Thought content normal.     ?   Judgment: Judgment normal.  ? ? ?  Results for orders placed or performed in visit on 01/07/22  ?Rapid Strep screen([facility])  ? Specimen: Other  ? Other  ?Result Value Ref Range  ? Strep Gp A Ag, IA W/Reflex Negative Negative  ?Culture, Group A Strep  ? Other  ?Result Value Ref Range  ? Strep A Culture Negative   ? ?   ?Assessment & Plan:  ? ?Problem List Items Addressed This Visit   ? ?  ? Other  ? Eating disorder with ongoing treatment - Primary  ?  Chronic. Stable. Has completed outpatient therapy.  Maintaining on his own at this point.  Feels like things are going well.  Continue with current medication regimen.  Follow up in 6 months.  Call sooner if concerns arise.  ? ?  ?  ?  ? ?Follow up plan: ?Return in about 6 months (around 08/19/2022) for Physical and Fasting labs. ? ? ? ? ? ?

## 2022-02-17 ENCOUNTER — Ambulatory Visit: Payer: BC Managed Care – PPO | Admitting: Nurse Practitioner

## 2022-02-17 ENCOUNTER — Encounter: Payer: Self-pay | Admitting: Nurse Practitioner

## 2022-02-17 VITALS — BP 111/74 | HR 53 | Temp 98.8°F | Wt 182.4 lb

## 2022-02-17 DIAGNOSIS — F509 Eating disorder, unspecified: Secondary | ICD-10-CM

## 2022-02-17 NOTE — Assessment & Plan Note (Signed)
Chronic. Stable. Has completed outpatient therapy.  Maintaining on his own at this point.  Feels like things are going well.  Continue with current medication regimen.  Follow up in 6 months.  Call sooner if concerns arise.  ?

## 2022-07-02 ENCOUNTER — Other Ambulatory Visit: Payer: Self-pay | Admitting: Nurse Practitioner

## 2022-07-02 NOTE — Telephone Encounter (Signed)
Requested medication (s) are due for refill today: Yes  Requested medication (s) are on the active medication list: Yes  Last refill:  01/06/22  Future visit scheduled: Yes  Notes to clinic:  Unable to refill per protocol, cannot delegate.      Requested Prescriptions  Pending Prescriptions Disp Refills   metoCLOPramide (REGLAN) 5 MG tablet [Pharmacy Med Name: METOCLOPRAMIDE 5 MG TABLET] 360 tablet 1    Sig: TAKE 1 TABLET (5 MG TOTAL) BY MOUTH IN THE MORNING, AT NOON, IN THE EVENING, AND AT BEDTIME.     Not Delegated - Gastroenterology: Antiemetics - metoclopramide Failed - 07/02/2022  2:42 AM      Failed - This refill cannot be delegated      Passed - Cr in normal range and within 360 days    Creatinine, Ser  Date Value Ref Range Status  08/19/2021 1.03 0.76 - 1.27 mg/dL Final         Passed - Valid encounter within last 6 months    Recent Outpatient Visits           4 months ago Eating disorder with ongoing treatment   [facility] Larae Grooms, NP   5 months ago Sore throat   [facility] Vigg, Avanti, MD   10 months ago Annual physical exam   [facility] Larae Grooms, NP   11 months ago Gastroparesis   [facility] Larae Grooms, NP       Future Appointments             In 1 month Larae Grooms, NP [facility], [facility]            Signed Prescriptions Disp Refills   sertraline (ZOLOFT) 50 MG tablet 90 tablet 0    Sig: TAKE 1 TABLET BY MOUTH EVERY DAY IN THE MORNING     Psychiatry:  Antidepressants - SSRI - sertraline Passed - 07/02/2022  2:42 AM      Passed - AST in normal range and within 360 days    AST  Date Value Ref Range Status  08/19/2021 17 0 - 40 IU/L Final         Passed - ALT in normal range and within 360 days    ALT  Date Value Ref Range Status  08/19/2021 7 0 - 44 IU/L Final         Passed - Completed PHQ-2 or PHQ-9 in the last 360 days      Passed - Valid  encounter within last 6 months    Recent Outpatient Visits           4 months ago Eating disorder with ongoing treatment   [facility] Larae Grooms, NP   5 months ago Sore throat   [facility] Vigg, Avanti, MD   10 months ago Annual physical exam   [facility] Larae Grooms, NP   11 months ago Gastroparesis   [facility] Larae Grooms, NP       Future Appointments             In 1 month Larae Grooms, NP [facility], [facility]

## 2022-08-19 ENCOUNTER — Encounter: Payer: BC Managed Care – PPO | Admitting: Nurse Practitioner

## 2022-08-20 NOTE — Progress Notes (Signed)
BP 110/62   Pulse (!) 49   Temp 98.7 F (37.1 C) (Oral)   Ht 5' 11.85" (1.825 m)   Wt 181 lb 8 oz (82.3 kg)   SpO2 98%   BMI 24.72 kg/m    Subjective:    Patient ID: Antonio Brave., male    DOB: 1981/07/12, 41 y.o.   MRN: [id number]  HPI: Antonio Wiklund. is a 41 y.o. male presenting on 08/21/2022 for comprehensive medical examination. Current medical complaints include:none  He currently lives with: Interim Problems from his last visit: no  GERD GERD control status: controlled Satisfied with current treatment? yes Heartburn frequency: daily Medication side effects: no  Medication compliance: stable Can tell when he misses a dose of the Reglan but feels like symptoms are well controlled when he is taking it.    Denies HA, CP, SOB, dizziness, palpitations, visual changes, and lower extremity swelling.   Depression Screen done today and results listed below:     08/21/2022    8:24 AM 02/17/2022    9:08 AM 08/19/2021    9:13 AM 07/15/2021    9:13 AM  Depression screen PHQ 2/9  Decreased Interest 0 0 0 0  Down, Depressed, Hopeless 0 0 0 0  PHQ - 2 Score 0 0 0 0  Altered sleeping 0 0 0 0  Tired, decreased energy 0 0 0 0  Change in appetite 0 0 0 0  Feeling bad or failure about yourself  0 0 0 0  Trouble concentrating 0 0 0 0  Moving slowly or fidgety/restless 0 0 0 0  Suicidal thoughts 0 0 0 0  PHQ-9 Score 0 0 0 0  Difficult doing work/chores Not difficult at all Not difficult at all Not difficult at all Not difficult at all    The patient does not have a history of falls. I did complete a risk assessment for falls. A plan of care for falls was documented.   Past Medical History:  Past Medical History:  Diagnosis Date   Anxiety    Eating disorder    GERD (gastroesophageal reflux disease)     Surgical History:  Past Surgical History:  Procedure Laterality Date   pilomindal cyst     TESTICLE REMOVAL Right 1996   VASECTOMY  11/2020     Medications:  Current Outpatient Medications on File Prior to Visit  Medication Sig   metoCLOPramide (REGLAN) 5 MG tablet TAKE 1 TABLET (5 MG TOTAL) BY MOUTH IN THE MORNING, AT NOON, IN THE EVENING, AND AT BEDTIME.   No current facility-administered medications on file prior to visit.    Allergies:  Allergies  Allergen Reactions   Other Hives    IV DYE-HIVES IN ARMPITS    Social History:  Social History   Socioeconomic History   Marital status: Married    Spouse name: Not on file   Number of children: Not on file   Years of education: Not on file   Highest education level: Not on file  Occupational History   Not on file  Tobacco Use   Smoking status: Never   Smokeless tobacco: Never  Vaping Use   Vaping Use: Never used  Substance and Sexual Activity   Alcohol use: Yes    Alcohol/week: 8.0 standard drinks of alcohol    Types: 8 Cans of beer per week   Drug use: Never   Sexual activity: Yes  Other Topics Concern   Not on file  Social  History Narrative   Not on file   Social Determinants of Health   Financial Resource Strain: Not on file  Food Insecurity: Not on file  Transportation Needs: Not on file  Physical Activity: Not on file  Stress: Not on file  Social Connections: Not on file  Intimate Partner Violence: Not on file   Social History   Tobacco Use  Smoking Status Never  Smokeless Tobacco Never   Social History   Substance and Sexual Activity  Alcohol Use Yes   Alcohol/week: 8.0 standard drinks of alcohol   Types: 8 Cans of beer per week    Family History:  Family History  Problem Relation Age of Onset   Hypertension Mother    Hypertension Father    Obesity Father    Obesity Brother    Hypertension Brother    Cancer Paternal Grandmother     Past medical history, surgical history, medications, allergies, family history and social history reviewed with patient today and changes made to appropriate areas of the chart.   Review of  Systems  Constitutional:  Negative for weight loss.  Eyes:  Negative for blurred vision and double vision.  Respiratory:  Negative for shortness of breath.   Cardiovascular:  Negative for chest pain, palpitations and leg swelling.  Gastrointestinal:  Negative for heartburn.  Neurological:  Negative for dizziness and headaches.   All other ROS negative except what is listed above and in the HPI.      Objective:    BP 110/62   Pulse (!) 49   Temp 98.7 F (37.1 C) (Oral)   Ht 5' 11.85" (1.825 m)   Wt 181 lb 8 oz (82.3 kg)   SpO2 98%   BMI 24.72 kg/m   Wt Readings from Last 3 Encounters:  08/21/22 181 lb 8 oz (82.3 kg)  02/17/22 182 lb 6.4 oz (82.7 kg)  01/07/22 182 lb 3.2 oz (82.6 kg)    Physical Exam Vitals and nursing note reviewed.  Constitutional:      General: He is not in acute distress.    Appearance: Normal appearance. He is normal weight. He is not ill-appearing, toxic-appearing or diaphoretic.  HENT:     Head: Normocephalic.     Right Ear: Tympanic membrane, ear canal and external ear normal.     Left Ear: Tympanic membrane, ear canal and external ear normal.     Nose: Nose normal. No congestion or rhinorrhea.     Mouth/Throat:     Mouth: Mucous membranes are moist.  Eyes:     General:        Right eye: No discharge.        Left eye: No discharge.     Extraocular Movements: Extraocular movements intact.     Conjunctiva/sclera: Conjunctivae normal.     Pupils: Pupils are equal, round, and reactive to light.  Cardiovascular:     Rate and Rhythm: Normal rate and regular rhythm.     Heart sounds: No murmur heard. Pulmonary:     Effort: Pulmonary effort is normal. No respiratory distress.     Breath sounds: Normal breath sounds. No wheezing, rhonchi or rales.  Abdominal:     General: Abdomen is flat. Bowel sounds are normal. There is no distension.     Palpations: Abdomen is soft.     Tenderness: There is no abdominal tenderness. There is no guarding.   Musculoskeletal:     Cervical back: Normal range of motion and neck supple.  Skin:  General: Skin is warm and dry.     Capillary Refill: Capillary refill takes less than 2 seconds.  Neurological:     General: No focal deficit present.     Mental Status: He is alert and oriented to person, place, and time.     Cranial Nerves: No cranial nerve deficit.     Motor: No weakness.     Deep Tendon Reflexes: Reflexes normal.  Psychiatric:        Mood and Affect: Mood normal.        Behavior: Behavior normal.        Thought Content: Thought content normal.        Judgment: Judgment normal.     Results for orders placed or performed in visit on 01/07/22  Rapid Strep screen([facility])   Specimen: Other   Other  Result Value Ref Range   Strep Gp A Ag, IA W/Reflex Negative Negative  Culture, Group A Strep   Other  Result Value Ref Range   Strep A Culture Negative       Assessment & Plan:   Problem List Items Addressed This Visit       Other   Heartburn    Chronic.  Controlled.  Continue with current medication regimen of Aciphex.  Patient would like to wean off of medication.  Discussed how to properly do that by decreasing frequency to every other day then every 3rd day then stop. Labs ordered today.  Return to clinic in 6 months for reevaluation.  Call sooner if concerns arise.        Eating disorder with ongoing treatment    Chronic. Controlled. Has completed outpatient therapy.  Maintaining on his own at this point.  Feels like things are going well.  Continue with current medication regimen of Zoloft and Reglan.  Refills sent today.  Follow up in 6 months.  Call sooner if concerns arise.       Other Visit Diagnoses     Annual physical exam    -  Primary   Health maintenance reviewed during visit today.  Labs ordered. Flu shot given. TDAP up to date.    Relevant Orders   TSH   Lipid panel   CBC with Differential/Platelet   Comprehensive metabolic panel    Urinalysis, Routine w reflex microscopic   Screening for ischemic heart disease       Relevant Orders   Lipid panel   Need for influenza vaccination       Relevant Orders   Flu Vaccine QUAD 6+ mos PF IM (Fluarix Quad PF) (Completed)        Discussed aspirin prophylaxis for myocardial infarction prevention and decision was it was not indicated  LABORATORY TESTING:  Health maintenance labs ordered today as discussed above.    IMMUNIZATIONS:   - Tdap: Tetanus vaccination status reviewed: last tetanus booster within 10 years. - Influenza: Up to date - Pneumovax: Not applicable - Prevnar: Not applicable - HPV:  discussed at visit today. - Zostavax vaccine: Not applicable  SCREENING: - Colonoscopy: Not applicable  Discussed with patient purpose of the colonoscopy is to detect colon cancer at curable precancerous or early stages   - AAA Screening: Not applicable  -Hearing Test: Not applicable  -Spirometry: Not applicable   PATIENT COUNSELING:    Sexuality: Discussed sexually transmitted diseases, partner selection, use of condoms, avoidance of unintended pregnancy  and contraceptive alternatives.   Advised to avoid cigarette smoking.  I discussed with the patient that most  people either abstain from alcohol or drink within safe limits (<=14/week and <=4 drinks/occasion for males, <=7/weeks and <= 3 drinks/occasion for females) and that the risk for alcohol disorders and other health effects rises proportionally with the number of drinks per week and how often a drinker exceeds daily limits.  Discussed cessation/primary prevention of drug use and availability of treatment for abuse.   Diet: Encouraged to adjust caloric intake to maintain  or achieve ideal body weight, to reduce intake of dietary saturated fat and total fat, to limit sodium intake by avoiding high sodium foods and not adding table salt, and to maintain adequate dietary potassium and calcium preferably from fresh  fruits, vegetables, and low-fat dairy products.    stressed the importance of regular exercise  Injury prevention: Discussed safety belts, safety helmets, smoke detector, smoking near bedding or upholstery.   Dental health: Discussed importance of regular tooth brushing, flossing, and dental visits.   Follow up plan: NEXT PREVENTATIVE PHYSICAL DUE IN 1 YEAR. Return in about 6 months (around 02/20/2023) for HTN, HLD, DM2 FU.

## 2022-08-21 ENCOUNTER — Ambulatory Visit (INDEPENDENT_AMBULATORY_CARE_PROVIDER_SITE_OTHER): Payer: BC Managed Care – PPO | Admitting: Nurse Practitioner

## 2022-08-21 ENCOUNTER — Encounter: Payer: Self-pay | Admitting: Nurse Practitioner

## 2022-08-21 VITALS — BP 110/62 | HR 49 | Temp 98.7°F | Ht 71.85 in | Wt 181.5 lb

## 2022-08-21 DIAGNOSIS — F509 Eating disorder, unspecified: Secondary | ICD-10-CM

## 2022-08-21 DIAGNOSIS — R12 Heartburn: Secondary | ICD-10-CM | POA: Diagnosis not present

## 2022-08-21 DIAGNOSIS — Z0000 Encounter for general adult medical examination without abnormal findings: Secondary | ICD-10-CM

## 2022-08-21 DIAGNOSIS — Z23 Encounter for immunization: Secondary | ICD-10-CM

## 2022-08-21 DIAGNOSIS — Z136 Encounter for screening for cardiovascular disorders: Secondary | ICD-10-CM

## 2022-08-21 LAB — URINALYSIS, ROUTINE W REFLEX MICROSCOPIC
Bilirubin, UA: NEGATIVE
Glucose, UA: NEGATIVE
Ketones, UA: NEGATIVE
Leukocytes,UA: NEGATIVE
Nitrite, UA: NEGATIVE
Protein,UA: NEGATIVE
RBC, UA: NEGATIVE
Specific Gravity, UA: 1.015 (ref 1.005–1.030)
Urobilinogen, Ur: 0.2 mg/dL (ref 0.2–1.0)
pH, UA: 8.5 — ABNORMAL HIGH (ref 5.0–7.5)

## 2022-08-21 MED ORDER — SERTRALINE HCL 50 MG PO TABS: ORAL_TABLET | ORAL | 1 refills | Status: DC

## 2022-08-21 MED ORDER — RABEPRAZOLE SODIUM 20 MG PO TBEC: 20.0000 mg | DELAYED_RELEASE_TABLET | Freq: Every day | ORAL | 1 refills | Status: DC

## 2022-08-21 NOTE — Assessment & Plan Note (Signed)
Chronic.  Controlled.  Continue with current medication regimen of Aciphex.  Patient would like to wean off of medication.  Discussed how to properly do that by decreasing frequency to every other day then every 3rd day then stop. Labs ordered today.  Return to clinic in 6 months for reevaluation.  Call sooner if concerns arise.

## 2022-08-21 NOTE — Assessment & Plan Note (Signed)
Chronic. Controlled. Has completed outpatient therapy.  Maintaining on his own at this point.  Feels like things are going well.  Continue with current medication regimen of Zoloft and Reglan.  Refills sent today.  Follow up in 6 months.  Call sooner if concerns arise.

## 2022-08-22 LAB — COMPREHENSIVE METABOLIC PANEL
ALT: 8 IU/L (ref 0–44)
AST: 19 IU/L (ref 0–40)
Albumin/Globulin Ratio: 2.6 — ABNORMAL HIGH (ref 1.2–2.2)
Albumin: 4.7 g/dL (ref 4.1–5.1)
Alkaline Phosphatase: 52 IU/L (ref 44–121)
BUN/Creatinine Ratio: 12 (ref 9–20)
BUN: 12 mg/dL (ref 6–24)
Bilirubin Total: 0.8 mg/dL (ref 0.0–1.2)
CO2: 27 mmol/L (ref 20–29)
Calcium: 10.1 mg/dL (ref 8.7–10.2)
Chloride: 102 mmol/L (ref 96–106)
Creatinine, Ser: 1 mg/dL (ref 0.76–1.27)
Globulin, Total: 1.8 g/dL (ref 1.5–4.5)
Glucose: 88 mg/dL (ref 70–99)
Potassium: 4.3 mmol/L (ref 3.5–5.2)
Sodium: 142 mmol/L (ref 134–144)
Total Protein: 6.5 g/dL (ref 6.0–8.5)
eGFR: 98 mL/min/{1.73_m2} (ref >59)

## 2022-08-22 LAB — CBC WITH DIFFERENTIAL/PLATELET
Basophils Absolute: 0 10*3/uL (ref 0.0–0.2)
Basos: 1 %
EOS (ABSOLUTE): 0.2 10*3/uL (ref 0.0–0.4)
Eos: 4 %
Hematocrit: 41.9 % (ref 37.5–51.0)
Hemoglobin: 14.1 g/dL (ref 13.0–17.7)
Immature Grans (Abs): 0 10*3/uL (ref 0.0–0.1)
Immature Granulocytes: 0 %
Lymphocytes Absolute: 1.2 10*3/uL (ref 0.7–3.1)
Lymphs: 28 %
MCH: 31.7 pg (ref 26.6–33.0)
MCHC: 33.7 g/dL (ref 31.5–35.7)
MCV: 94 fL (ref 79–97)
Monocytes Absolute: 0.3 10*3/uL (ref 0.1–0.9)
Monocytes: 7 %
Neutrophils Absolute: 2.7 10*3/uL (ref 1.4–7.0)
Neutrophils: 60 %
Platelets: 205 10*3/uL (ref 150–450)
RBC: 4.45 x10E6/uL (ref 4.14–5.80)
RDW: 11.9 % (ref 11.6–15.4)
WBC: 4.4 10*3/uL (ref 3.4–10.8)

## 2022-08-22 LAB — TSH: TSH: 1.2 u[IU]/mL (ref 0.450–4.500)

## 2022-08-22 LAB — LIPID PANEL
Chol/HDL Ratio: 2.3 ratio (ref 0.0–5.0)
Cholesterol, Total: 149 mg/dL (ref 100–199)
HDL: 66 mg/dL (ref >39)
LDL Chol Calc (NIH): 70 mg/dL (ref 0–99)
Triglycerides: 64 mg/dL (ref 0–149)
VLDL Cholesterol Cal: 13 mg/dL (ref 5–40)

## 2022-08-24 NOTE — Progress Notes (Signed)
Hi Wilden. It was nice to see you last week.  Your lab work looks good.  No concerns at this time. Continue with your current medication regimen.  Follow up as discussed.  Please let me know if you have any questions.

## 2022-12-24 ENCOUNTER — Other Ambulatory Visit: Payer: Self-pay | Admitting: Nurse Practitioner

## 2022-12-24 NOTE — Telephone Encounter (Signed)
Requested medications are due for refill today.  Provider to determine  Requested medications are on the active medications list.  yes  Last refill. 07/2022 #360 1 rf  Future visit scheduled.   yes  Notes to clinic.  Refill not delegated.    Requested Prescriptions  Pending Prescriptions Disp Refills   metoCLOPramide (REGLAN) 5 MG tablet [Pharmacy Med Name: METOCLOPRAMIDE 5 MG TABLET] 360 tablet 1    Sig: TAKE 1 TABLET (5 MG TOTAL) BY MOUTH IN THE MORNING, AT NOON, IN THE EVENING, AND AT BEDTIME.     Not Delegated - Gastroenterology: Antiemetics - metoclopramide Failed - 12/24/2022  9:37 AM      Failed - This refill cannot be delegated      Passed - Cr in normal range and within 360 days    Creatinine, Ser  Date Value Ref Range Status  08/21/2022 1.00 0.76 - 1.27 mg/dL Final         Passed - Valid encounter within last 6 months    Recent Outpatient Visits           4 months ago Annual physical exam   [place] Jon Billings, NP   10 months ago Eating disorder with ongoing treatment   [place], NP   11 months ago Sore throat   [place] Vigg, Avanti, MD   1 year ago Annual physical exam   [place] Jon Billings, NP   1 year ago Gastroparesis   [place] Jon Billings, NP       Future Appointments             In 2 months Jon Billings, NP [place], [facility]

## 2023-02-22 ENCOUNTER — Ambulatory Visit: Payer: BC Managed Care – PPO | Admitting: Nurse Practitioner

## 2023-02-22 ENCOUNTER — Encounter: Payer: Self-pay | Admitting: Nurse Practitioner

## 2023-02-22 VITALS — BP 113/64 | HR 56 | Temp 98.9°F | Wt 182.1 lb

## 2023-02-22 DIAGNOSIS — F509 Eating disorder, unspecified: Secondary | ICD-10-CM | POA: Diagnosis not present

## 2023-02-22 DIAGNOSIS — K3184 Gastroparesis: Secondary | ICD-10-CM

## 2023-02-22 MED ORDER — RABEPRAZOLE SODIUM 20 MG PO TBEC: 20.0000 mg | DELAYED_RELEASE_TABLET | Freq: Every day | ORAL | 1 refills | Status: DC

## 2023-02-22 MED ORDER — METOCLOPRAMIDE HCL 5 MG PO TABS: 5.0000 mg | ORAL_TABLET | Freq: Four times a day (QID) | ORAL | 1 refills | Status: DC

## 2023-02-22 MED ORDER — SERTRALINE HCL 50 MG PO TABS: ORAL_TABLET | ORAL | 1 refills | Status: DC

## 2023-02-22 NOTE — Assessment & Plan Note (Signed)
Chronic. Controlled. Has completed outpatient therapy.  Maintaining on his own at this point.  Feels like things are going well.  Has considered stopping his Zoloft but not decided at this time. Continue with current medication regimen of Zoloft and Reglan.  Refills sent today.  Follow up in 6 months.  Call sooner if concerns arise.

## 2023-02-22 NOTE — Assessment & Plan Note (Addendum)
Chronic. Well controlled on current medication regimen of Reglan  QID.  Patient had completed therapy with nutritionist and therapist.  Feels like he is doing well on his own managing symptoms.  Follow up in 6 months.  Call sooner if concerns arise.

## 2023-02-22 NOTE — Progress Notes (Signed)
BP 113/64   Pulse (!) 56   Temp 98.9 F (37.2 C) (Oral)   Wt 182 lb 1.6 oz (82.6 kg)   SpO2 98%   BMI 24.80 kg/m    Subjective:    Patient ID: Antonio Fredrickson., male    DOB: 1981/08/09, 42 y.o.   MRN: [id number]  HPI: Antonio Hiney. is a 42 y.o. male  Chief Complaint  Patient presents with   Eating Disorder   Gastroesophageal Reflux   EATING DISORDER Patient states he is doing well.  Medication is working well for him.  He has been more active recently and dropped a couple of pounds.  He tried to stop the aciphex but restarted it due to recurrent reflux.  Denies concerns at visit today.  As long as he is taking his medication he is doing well.  The reglan really helps to control his symptoms.  He has considered coming off zoloft to see if he is able to maintain his mental health without the medication.   Relevant past medical, surgical, family and social history reviewed and updated as indicated. Interim medical history since our last visit reviewed. Allergies and medications reviewed and updated.  Review of Systems  Constitutional:  Negative for unexpected weight change.  Gastrointestinal:        Acid reflux  Psychiatric/Behavioral:         Denies problems with eating disorder    Per HPI unless specifically indicated above     Objective:    BP 113/64   Pulse (!) 56   Temp 98.9 F (37.2 C) (Oral)   Wt 182 lb 1.6 oz (82.6 kg)   SpO2 98%   BMI 24.80 kg/m   Wt Readings from Last 3 Encounters:  02/22/23 182 lb 1.6 oz (82.6 kg)  08/21/22 181 lb 8 oz (82.3 kg)  02/17/22 182 lb 6.4 oz (82.7 kg)    Physical Exam Vitals and nursing note reviewed.  Constitutional:      General: He is not in acute distress.    Appearance: Normal appearance. He is normal weight. He is not ill-appearing, toxic-appearing or diaphoretic.  HENT:     Head: Normocephalic.     Right Ear: External ear normal.     Left Ear: External ear normal.     Nose: Nose normal.  No congestion or rhinorrhea.     Mouth/Throat:     Mouth: Mucous membranes are moist.  Eyes:     General:        Right eye: No discharge.        Left eye: No discharge.     Extraocular Movements: Extraocular movements intact.     Conjunctiva/sclera: Conjunctivae normal.     Pupils: Pupils are equal, round, and reactive to light.  Cardiovascular:     Rate and Rhythm: Normal rate and regular rhythm.     Heart sounds: No murmur heard. Pulmonary:     Effort: Pulmonary effort is normal. No respiratory distress.     Breath sounds: Normal breath sounds. No wheezing, rhonchi or rales.  Abdominal:     General: Abdomen is flat. Bowel sounds are normal.  Musculoskeletal:     Cervical back: Normal range of motion and neck supple.  Skin:    General: Skin is warm and dry.     Capillary Refill: Capillary refill takes less than 2 seconds.  Neurological:     General: No focal deficit present.     Mental Status: He is  alert and oriented to person, place, and time.  Psychiatric:        Mood and Affect: Mood normal.        Behavior: Behavior normal.        Thought Content: Thought content normal.        Judgment: Judgment normal.     Results for orders placed or performed in visit on 08/21/22  TSH  Result Value Ref Range   TSH 1.200 0.450 - 4.500 uIU/mL  Lipid panel  Result Value Ref Range   Cholesterol, Total 149 100 - 199 mg/dL   Triglycerides 64 0 - 149 mg/dL   HDL 66 >82 mg/dL   VLDL Cholesterol Cal 13 5 - 40 mg/dL   LDL Chol Calc (NIH) 70 0 - 99 mg/dL   Chol/HDL Ratio 2.3 0.0 - 5.0 ratio  CBC with Differential/Platelet  Result Value Ref Range   WBC 4.4 3.4 - 10.8 x10E3/uL   RBC 4.45 4.14 - 5.80 x10E6/uL   Hemoglobin 14.1 13.0 - 17.7 g/dL   Hematocrit 95.6 21.3 - 51.0 %   MCV 94 79 - 97 fL   MCH 31.7 26.6 - 33.0 pg   MCHC 33.7 31.5 - 35.7 g/dL   RDW 08.6 57.8 - 46.9 %   Platelets 205 150 - 450 x10E3/uL   Neutrophils 60 Not Estab. %   Lymphs 28 Not Estab. %   Monocytes 7 Not  Estab. %   Eos 4 Not Estab. %   Basos 1 Not Estab. %   Neutrophils Absolute 2.7 1.4 - 7.0 x10E3/uL   Lymphocytes Absolute 1.2 0.7 - 3.1 x10E3/uL   Monocytes Absolute 0.3 0.1 - 0.9 x10E3/uL   EOS (ABSOLUTE) 0.2 0.0 - 0.4 x10E3/uL   Basophils Absolute 0.0 0.0 - 0.2 x10E3/uL   Immature Granulocytes 0 Not Estab. %   Immature Grans (Abs) 0.0 0.0 - 0.1 x10E3/uL  Comprehensive metabolic panel  Result Value Ref Range   Glucose 88 70 - 99 mg/dL   BUN 12 6 - 24 mg/dL   Creatinine, Ser 6.29 0.76 - 1.27 mg/dL   eGFR 98 >52 WU/XLK/4.40   BUN/Creatinine Ratio 12 9 - 20   Sodium 142 134 - 144 mmol/L   Potassium 4.3 3.5 - 5.2 mmol/L   Chloride 102 96 - 106 mmol/L   CO2 27 20 - 29 mmol/L   Calcium 10.1 8.7 - 10.2 mg/dL   Total Protein 6.5 6.0 - 8.5 g/dL   Albumin 4.7 4.1 - 5.1 g/dL   Globulin, Total 1.8 1.5 - 4.5 g/dL   Albumin/Globulin Ratio 2.6 (H) 1.2 - 2.2   Bilirubin Total 0.8 0.0 - 1.2 mg/dL   Alkaline Phosphatase 52 44 - 121 IU/L   AST 19 0 - 40 IU/L   ALT 8 0 - 44 IU/L  Urinalysis, Routine w reflex microscopic  Result Value Ref Range   Specific Gravity, UA 1.015 1.005 - 1.030   pH, UA 8.5 (H) 5.0 - 7.5   Color, UA Yellow Yellow   Appearance Ur Clear Clear   Leukocytes,UA Negative Negative   Protein,UA Negative Negative/Trace   Glucose, UA Negative Negative   Ketones, UA Negative Negative   RBC, UA Negative Negative   Bilirubin, UA Negative Negative   Urobilinogen, Ur 0.2 0.2 - 1.0 mg/dL   Nitrite, UA Negative Negative      Assessment & Plan:   Problem List Items Addressed This Visit       Digestive   Gastroparesis  Chronic. Well controlled on current medication regimen of Reglan  QID.  Patient had completed therapy with nutritionist and therapist.  Feels like he is doing well on his own managing symptoms.  Follow up in 6 months.  Call sooner if concerns arise.       Relevant Orders   Comp Met (CMET)     Other   Eating disorder with ongoing treatment - Primary     Chronic. Controlled. Has completed outpatient therapy.  Maintaining on his own at this point.  Feels like things are going well.  Has considered stopping his Zoloft but not decided at this time. Continue with current medication regimen of Zoloft and Reglan.  Refills sent today.  Follow up in 6 months.  Call sooner if concerns arise.       Relevant Orders   Comp Met (CMET)     Follow up plan: Return in about 6 months (around 08/24/2023) for Physical and Fasting labs.

## 2023-02-23 LAB — COMPREHENSIVE METABOLIC PANEL
ALT: 7 IU/L (ref 0–44)
AST: 17 IU/L (ref 0–40)
Albumin/Globulin Ratio: 2.6 — ABNORMAL HIGH (ref 1.2–2.2)
Albumin: 4.6 g/dL (ref 4.1–5.1)
Alkaline Phosphatase: 54 IU/L (ref 44–121)
BUN/Creatinine Ratio: 12 (ref 9–20)
BUN: 13 mg/dL (ref 6–24)
Bilirubin Total: 0.8 mg/dL (ref 0.0–1.2)
CO2: 21 mmol/L (ref 20–29)
Calcium: 9.7 mg/dL (ref 8.7–10.2)
Chloride: 103 mmol/L (ref 96–106)
Creatinine, Ser: 1.05 mg/dL (ref 0.76–1.27)
Globulin, Total: 1.8 g/dL (ref 1.5–4.5)
Glucose: 83 mg/dL (ref 70–99)
Potassium: 4.2 mmol/L (ref 3.5–5.2)
Sodium: 141 mmol/L (ref 134–144)
Total Protein: 6.4 g/dL (ref 6.0–8.5)
eGFR: 92 mL/min/{1.73_m2} (ref >59)

## 2023-02-23 NOTE — Progress Notes (Signed)
Hi Antonio Porter. It was nice to see you yesterday.  Your lab work looks good.  No concerns at this time. Continue with your current medication regimen.  Follow up as discussed.  Please let me know if you have any questions.

## 2023-05-04 DIAGNOSIS — F411 Generalized anxiety disorder: Secondary | ICD-10-CM | POA: Diagnosis not present

## 2023-05-13 DIAGNOSIS — F5089 Other specified eating disorder: Secondary | ICD-10-CM | POA: Diagnosis not present

## 2023-06-08 DIAGNOSIS — F411 Generalized anxiety disorder: Secondary | ICD-10-CM | POA: Diagnosis not present

## 2023-06-16 DIAGNOSIS — F5089 Other specified eating disorder: Secondary | ICD-10-CM | POA: Diagnosis not present

## 2023-06-23 ENCOUNTER — Other Ambulatory Visit: Payer: Self-pay | Admitting: Nurse Practitioner

## 2023-06-24 NOTE — Telephone Encounter (Signed)
Requested Prescriptions  Pending Prescriptions Disp Refills   sertraline (ZOLOFT) 50 MG tablet [Pharmacy Med Name: SERTRALINE HCL 50 MG TABLET] 90 tablet 1    Sig: TAKE 1 TABLET BY MOUTH EVERY DAY IN THE MORNING     Psychiatry:  Antidepressants - SSRI - sertraline Passed - 06/23/2023 11:54 AM      Passed - AST in normal range and within 360 days    AST  Date Value Ref Range Status  02/22/2023 17 0 - 40 IU/L Final         Passed - ALT in normal range and within 360 days    ALT  Date Value Ref Range Status  02/22/2023 7 0 - 44 IU/L Final         Passed - Completed PHQ-2 or PHQ-9 in the last 360 days      Passed - Valid encounter within last 6 months    Recent Outpatient Visits           4 months ago Eating disorder with ongoing treatment   [place] [facility] Larae Grooms, NP   10 months ago Annual physical exam   [place] [facility] Larae Grooms, NP   1 year ago Eating disorder with ongoing treatment   [place] [facility] Larae Grooms, NP   1 year ago Sore throat   [place] [facility] Vigg, Avanti, MD   1 year ago Annual physical exam   [facility] Larae Grooms, NP       Future Appointments             In 2 months Larae Grooms, NP  [place] [facility]             metoCLOPramide (REGLAN) 5 MG tablet [Pharmacy Med Name: METOCLOPRAMIDE 5 MG TABLET] 360 tablet 1    Sig: TAKE 1 TABLET (5 MG TOTAL) BY MOUTH IN THE MORNING, AT NOON, IN THE EVENING, AND AT BEDTIME.     Not Delegated - Gastroenterology: Antiemetics - metoclopramide Failed - 06/23/2023 11:54 AM      Failed - This refill cannot be delegated      Passed - Cr in normal range and within 360 days    Creatinine, Ser  Date Value Ref Range Status  02/22/2023 1.05 0.76 - 1.27 mg/dL Final         Passed - Valid encounter within last 6 months    Recent Outpatient Visits            4 months ago Eating disorder with ongoing treatment   [place] [facility] Larae Grooms, NP   10 months ago Annual physical exam   [place] [facility] Larae Grooms, NP   1 year ago Eating disorder with ongoing treatment   [place] [facility] Larae Grooms, NP   1 year ago Sore throat   [place] [facility] Vigg, Avanti, MD   1 year ago Annual physical exam   [place] [facility] Larae Grooms, NP       Future Appointments             In 2 months Larae Grooms, NP [place] [facility]

## 2023-06-24 NOTE — Telephone Encounter (Signed)
Requested medication (s) are due for refill today: no  Requested medication (s) are on the active medication list: yes    Last refill: 02/22/23  #360  1 refill  Future visit scheduled yes 08/24/23  Notes to clinic:Not delegated. Cannot refuse non-delegated meds. Refill available, due 08/24/23  Requested Prescriptions  Pending Prescriptions Disp Refills   metoCLOPramide (REGLAN) 5 MG tablet [Pharmacy Med Name: METOCLOPRAMIDE 5 MG TABLET] 360 tablet 1    Sig: TAKE 1 TABLET (5 MG TOTAL) BY MOUTH IN THE MORNING, AT NOON, IN THE EVENING, AND AT BEDTIME.     Not Delegated - Gastroenterology: Antiemetics - metoclopramide Failed - 06/23/2023 11:54 AM      Failed - This refill cannot be delegated      Passed - Cr in normal range and within 360 days    Creatinine, Ser  Date Value Ref Range Status  02/22/2023 1.05 0.76 - 1.27 mg/dL Final         Passed - Valid encounter within last 6 months    Recent Outpatient Visits           4 months ago Eating disorder with ongoing treatment   [place] [facility] Larae Grooms, NP   10 months ago Annual physical exam   [place] [facility] Larae Grooms, NP   1 year ago Eating disorder with ongoing treatment   [place] [facility] Larae Grooms, NP   1 year ago Sore throat   [place] [facility] Vigg, Avanti, MD   1 year ago Annual physical exam   [place] [facility] Larae Grooms, NP       Future Appointments             In 2 months Larae Grooms, NP [place] [facility]            Refused Prescriptions Disp Refills   sertraline (ZOLOFT) 50 MG tablet [Pharmacy Med Name: SERTRALINE HCL 50 MG TABLET] 90 tablet 1    Sig: TAKE 1 TABLET BY MOUTH EVERY DAY IN THE MORNING     Psychiatry:  Antidepressants - SSRI - sertraline Passed - 06/23/2023 11:54 AM      Passed - AST in normal range and within 360 days    AST  Date  Value Ref Range Status  02/22/2023 17 0 - 40 IU/L Final         Passed - ALT in normal range and within 360 days    ALT  Date Value Ref Range Status  02/22/2023 7 0 - 44 IU/L Final         Passed - Completed PHQ-2 or PHQ-9 in the last 360 days      Passed - Valid encounter within last 6 months    Recent Outpatient Visits           4 months ago Eating disorder with ongoing treatment   [place] [facility] Larae Grooms, NP   10 months ago Annual physical exam   [place] [facility] Larae Grooms, NP   1 year ago Eating disorder with ongoing treatment   [place] [facility] Larae Grooms, NP   1 year ago Sore throat   [place] [facility] Vigg, Avanti, MD   1 year ago Annual physical exam   [place] [facility] Larae Grooms, NP       Future Appointments  In 2 months Larae Grooms, NP [place] [facility]

## 2023-07-05 DIAGNOSIS — F411 Generalized anxiety disorder: Secondary | ICD-10-CM | POA: Diagnosis not present

## 2023-07-05 DIAGNOSIS — F5001 Anorexia nervosa, restricting type: Secondary | ICD-10-CM | POA: Diagnosis not present

## 2023-07-06 DIAGNOSIS — F411 Generalized anxiety disorder: Secondary | ICD-10-CM | POA: Diagnosis not present

## 2023-07-13 DIAGNOSIS — F5089 Other specified eating disorder: Secondary | ICD-10-CM | POA: Diagnosis not present

## 2023-08-16 DIAGNOSIS — F50024 Anorexia nervosa, binge eating/purging type, in remission: Secondary | ICD-10-CM | POA: Diagnosis not present

## 2023-08-23 NOTE — Progress Notes (Unsigned)
There were no vitals taken for this visit.   Subjective:    Patient ID: Antonio Porter., male    DOB: June 22, 1981, 42 y.o.   MRN: [id number]  HPI: Antonio Porter. is a 42 y.o. male presenting on 08/24/2023 for comprehensive medical examination. Current medical complaints include:none  He currently lives with: Interim Problems from his last visit: no  GERD GERD control status: controlled Satisfied with current treatment? yes Heartburn frequency: daily Medication side effects: no  Medication compliance: stable Can tell when he misses a dose of the Reglan but feels like symptoms are well controlled when he is taking it.    Denies HA, CP, SOB, dizziness, palpitations, visual changes, and lower extremity swelling.   Depression Screen done today and results listed below:     02/22/2023    8:20 AM 08/21/2022    8:24 AM 02/17/2022    9:08 AM 08/19/2021    9:13 AM 07/15/2021    9:13 AM  Depression screen PHQ 2/9  Decreased Interest 0 0 0 0 0  Down, Depressed, Hopeless 0 0 0 0 0  PHQ - 2 Score 0 0 0 0 0  Altered sleeping 0 0 0 0 0  Tired, decreased energy 0 0 0 0 0  Change in appetite 0 0 0 0 0  Feeling bad or failure about yourself  0 0 0 0 0  Trouble concentrating 0 0 0 0 0  Moving slowly or fidgety/restless 0 0 0 0 0  Suicidal thoughts 0 0 0 0 0  PHQ-9 Score 0 0 0 0 0  Difficult doing work/chores Not difficult at all Not difficult at all Not difficult at all Not difficult at all Not difficult at all    The patient does not have a history of falls. I did complete a risk assessment for falls. A plan of care for falls was documented.   Past Medical History:  Past Medical History:  Diagnosis Date  . Anxiety   . Eating disorder   . GERD (gastroesophageal reflux disease)     Surgical History:  Past Surgical History:  Procedure Laterality Date  . pilomindal cyst    . TESTICLE REMOVAL Right 1996  . VASECTOMY  11/2020    Medications:  Current  Outpatient Medications on File Prior to Visit  Medication Sig  . metoCLOPramide (REGLAN) 5 MG tablet Take 1 tablet (5 mg total) by mouth in the morning, at noon, in the evening, and at bedtime.  . RABEprazole (ACIPHEX) 20 MG tablet Take 1 tablet (20 mg total) by mouth daily.  . sertraline (ZOLOFT) 50 MG tablet TAKE 1 TABLET BY MOUTH EVERY DAY IN THE MORNING   No current facility-administered medications on file prior to visit.    Allergies:  Allergies  Allergen Reactions  . Other Hives    IV DYE-HIVES IN ARMPITS    Social History:  Social History   Socioeconomic History  . Marital status: Married    Spouse name: Not on file  . Number of children: Not on file  . Years of education: Not on file  . Highest education level: Not on file  Occupational History  . Not on file  Tobacco Use  . Smoking status: Never  . Smokeless tobacco: Never  Vaping Use  . Vaping status: Never Used  Substance and Sexual Activity  . Alcohol use: Yes    Alcohol/week: 8.0 standard drinks of alcohol    Types: 8 Cans of beer per week  .  Drug use: Never  . Sexual activity: Yes  Other Topics Concern  . Not on file  Social History Narrative  . Not on file   Social Determinants of Health   Financial Resource Strain: Not on file  Food Insecurity: Not on file  Transportation Needs: Not on file  Physical Activity: Not on file  Stress: Not on file  Social Connections: Not on file  Intimate Partner Violence: Not on file   Social History   Tobacco Use  Smoking Status Never  Smokeless Tobacco Never   Social History   Substance and Sexual Activity  Alcohol Use Yes  . Alcohol/week: 8.0 standard drinks of alcohol  . Types: 8 Cans of beer per week    Family History:  Family History  Problem Relation Age of Onset  . Hypertension Mother   . Hypertension Father   . Obesity Father   . Obesity Brother   . Hypertension Brother   . Cancer Paternal Grandmother     Past medical history,  surgical history, medications, allergies, family history and social history reviewed with patient today and changes made to appropriate areas of the chart.   Review of Systems  Constitutional:  Negative for weight loss.  Eyes:  Negative for blurred vision and double vision.  Respiratory:  Negative for shortness of breath.   Cardiovascular:  Negative for chest pain, palpitations and leg swelling.  Gastrointestinal:  Negative for heartburn.  Neurological:  Negative for dizziness and headaches.   All other ROS negative except what is listed above and in the HPI.      Objective:    There were no vitals taken for this visit.  Wt Readings from Last 3 Encounters:  02/22/23 182 lb 1.6 oz (82.6 kg)  08/21/22 181 lb 8 oz (82.3 kg)  02/17/22 182 lb 6.4 oz (82.7 kg)    Physical Exam Vitals and nursing note reviewed.  Constitutional:      General: He is not in acute distress.    Appearance: Normal appearance. He is normal weight. He is not ill-appearing, toxic-appearing or diaphoretic.  HENT:     Head: Normocephalic.     Right Ear: Tympanic membrane, ear canal and external ear normal.     Left Ear: Tympanic membrane, ear canal and external ear normal.     Nose: Nose normal. No congestion or rhinorrhea.     Mouth/Throat:     Mouth: Mucous membranes are moist.  Eyes:     General:        Right eye: No discharge.        Left eye: No discharge.     Extraocular Movements: Extraocular movements intact.     Conjunctiva/sclera: Conjunctivae normal.     Pupils: Pupils are equal, round, and reactive to light.  Cardiovascular:     Rate and Rhythm: Normal rate and regular rhythm.     Heart sounds: No murmur heard. Pulmonary:     Effort: Pulmonary effort is normal. No respiratory distress.     Breath sounds: Normal breath sounds. No wheezing, rhonchi or rales.  Abdominal:     General: Abdomen is flat. Bowel sounds are normal. There is no distension.     Palpations: Abdomen is soft.      Tenderness: There is no abdominal tenderness. There is no guarding.  Musculoskeletal:     Cervical back: Normal range of motion and neck supple.  Skin:    General: Skin is warm and dry.     Capillary Refill: Capillary  refill takes less than 2 seconds.  Neurological:     General: No focal deficit present.     Mental Status: He is alert and oriented to person, place, and time.     Cranial Nerves: No cranial nerve deficit.     Motor: No weakness.     Deep Tendon Reflexes: Reflexes normal.  Psychiatric:        Mood and Affect: Mood normal.        Behavior: Behavior normal.        Thought Content: Thought content normal.        Judgment: Judgment normal.    Results for orders placed or performed in visit on 02/22/23  Comp Met (CMET)  Result Value Ref Range   Glucose 83 70 - 99 mg/dL   BUN 13 6 - 24 mg/dL   Creatinine, Ser 1.61 0.76 - 1.27 mg/dL   eGFR 92 >09 UE/AVW/0.98   BUN/Creatinine Ratio 12 9 - 20   Sodium 141 134 - 144 mmol/L   Potassium 4.2 3.5 - 5.2 mmol/L   Chloride 103 96 - 106 mmol/L   CO2 21 20 - 29 mmol/L   Calcium 9.7 8.7 - 10.2 mg/dL   Total Protein 6.4 6.0 - 8.5 g/dL   Albumin 4.6 4.1 - 5.1 g/dL   Globulin, Total 1.8 1.5 - 4.5 g/dL   Albumin/Globulin Ratio 2.6 (H) 1.2 - 2.2   Bilirubin Total 0.8 0.0 - 1.2 mg/dL   Alkaline Phosphatase 54 44 - 121 IU/L   AST 17 0 - 40 IU/L   ALT 7 0 - 44 IU/L      Assessment & Plan:   Problem List Items Addressed This Visit   None     Discussed aspirin prophylaxis for myocardial infarction prevention and decision was it was not indicated  LABORATORY TESTING:  Health maintenance labs ordered today as discussed above.    IMMUNIZATIONS:   - Tdap: Tetanus vaccination status reviewed: last tetanus booster within 10 years. - Influenza: Up to date - Pneumovax: Not applicable - Prevnar: Not applicable - HPV:  discussed at visit today. - Zostavax vaccine: Not applicable  SCREENING: - Colonoscopy: Not applicable   Discussed with patient purpose of the colonoscopy is to detect colon cancer at curable precancerous or early stages   - AAA Screening: Not applicable  -Hearing Test: Not applicable  -Spirometry: Not applicable   PATIENT COUNSELING:    Sexuality: Discussed sexually transmitted diseases, partner selection, use of condoms, avoidance of unintended pregnancy  and contraceptive alternatives.   Advised to avoid cigarette smoking.  I discussed with the patient that most people either abstain from alcohol or drink within safe limits (<=14/week and <=4 drinks/occasion for males, <=7/weeks and <= 3 drinks/occasion for females) and that the risk for alcohol disorders and other health effects rises proportionally with the number of drinks per week and how often a drinker exceeds daily limits.  Discussed cessation/primary prevention of drug use and availability of treatment for abuse.   Diet: Encouraged to adjust caloric intake to maintain  or achieve ideal body weight, to reduce intake of dietary saturated fat and total fat, to limit sodium intake by avoiding high sodium foods and not adding table salt, and to maintain adequate dietary potassium and calcium preferably from fresh fruits, vegetables, and low-fat dairy products.    stressed the importance of regular exercise  Injury prevention: Discussed safety belts, safety helmets, smoke detector, smoking near bedding or upholstery.   Dental health: Discussed importance  of regular tooth brushing, flossing, and dental visits.   Follow up plan: NEXT PREVENTATIVE PHYSICAL DUE IN 1 YEAR. No follow-ups on file.

## 2023-08-24 ENCOUNTER — Ambulatory Visit (INDEPENDENT_AMBULATORY_CARE_PROVIDER_SITE_OTHER): Payer: BC Managed Care – PPO | Admitting: Nurse Practitioner

## 2023-08-24 ENCOUNTER — Encounter: Payer: Self-pay | Admitting: Nurse Practitioner

## 2023-08-24 VITALS — BP 113/75 | HR 45 | Temp 97.7°F | Ht 71.0 in | Wt 169.4 lb

## 2023-08-24 DIAGNOSIS — F509 Eating disorder, unspecified: Secondary | ICD-10-CM | POA: Diagnosis not present

## 2023-08-24 DIAGNOSIS — Z0000 Encounter for general adult medical examination without abnormal findings: Secondary | ICD-10-CM

## 2023-08-24 DIAGNOSIS — K3184 Gastroparesis: Secondary | ICD-10-CM

## 2023-08-24 DIAGNOSIS — Z136 Encounter for screening for cardiovascular disorders: Secondary | ICD-10-CM

## 2023-08-24 DIAGNOSIS — R12 Heartburn: Secondary | ICD-10-CM

## 2023-08-24 DIAGNOSIS — R634 Abnormal weight loss: Secondary | ICD-10-CM

## 2023-08-24 LAB — URINALYSIS, ROUTINE W REFLEX MICROSCOPIC
Bilirubin, UA: NEGATIVE
Glucose, UA: NEGATIVE
Ketones, UA: NEGATIVE
Leukocytes,UA: NEGATIVE
Nitrite, UA: NEGATIVE
Protein,UA: NEGATIVE
RBC, UA: NEGATIVE
Specific Gravity, UA: 1.01 (ref 1.005–1.030)
Urobilinogen, Ur: 0.2 mg/dL (ref 0.2–1.0)
pH, UA: 7.5 (ref 5.0–7.5)

## 2023-08-24 MED ORDER — METOCLOPRAMIDE HCL 5 MG PO TABS: 5.0000 mg | ORAL_TABLET | Freq: Four times a day (QID) | ORAL | 1 refills | Status: DC

## 2023-08-24 MED ORDER — SERTRALINE HCL 50 MG PO TABS: ORAL_TABLET | ORAL | 1 refills | Status: DC

## 2023-08-24 MED ORDER — RABEPRAZOLE SODIUM 20 MG PO TBEC: 20.0000 mg | DELAYED_RELEASE_TABLET | Freq: Every day | ORAL | 1 refills | Status: DC

## 2023-08-24 NOTE — Assessment & Plan Note (Signed)
Chronic. Well controlled on current medication regimen of Reglan 5mg  QID.  Continues to work with a nutritionist and feels like he is doing well on his own managing symptoms.  Follow up in 6 months.  Call sooner if concerns arise.

## 2023-08-24 NOTE — Assessment & Plan Note (Signed)
Chronic.  Controlled.  Continue with current medication regimen of Aciphex. Refills sent today.  Return to clinic in 6 months for reevaluation.  Call sooner if concerns arise.

## 2023-08-24 NOTE — Assessment & Plan Note (Signed)
Reviewed weight with patient today.  He plans to work with his nutritionist to establish a plan to prevent further weight loss. Follow up in 6 months.  Call sooner if concerns arise.

## 2023-08-24 NOTE — Assessment & Plan Note (Signed)
Chronic. Controlled. Has completed outpatient therapy.  Maintaining on his own at this point.  Feels like things are going well.  Has considered stopping his Zoloft but not decided at this time. Continue with current medication regimen of Zoloft and Reglan.  Refills sent today.  Follow up in 6 months.  Call sooner if concerns arise.

## 2023-08-25 LAB — LIPID PANEL
Chol/HDL Ratio: 2.1 ratio (ref 0.0–5.0)
Cholesterol, Total: 153 mg/dL (ref 100–199)
HDL: 73 mg/dL (ref >39)
LDL Chol Calc (NIH): 68 mg/dL (ref 0–99)
Triglycerides: 55 mg/dL (ref 0–149)
VLDL Cholesterol Cal: 12 mg/dL (ref 5–40)

## 2023-08-25 LAB — CBC WITH DIFFERENTIAL/PLATELET
Basophils Absolute: 0 10*3/uL (ref 0.0–0.2)
Basos: 1 %
EOS (ABSOLUTE): 0.2 10*3/uL (ref 0.0–0.4)
Eos: 4 %
Hematocrit: 44.3 % (ref 37.5–51.0)
Hemoglobin: 14.5 g/dL (ref 13.0–17.7)
Immature Grans (Abs): 0 10*3/uL (ref 0.0–0.1)
Immature Granulocytes: 0 %
Lymphocytes Absolute: 1.2 10*3/uL (ref 0.7–3.1)
Lymphs: 26 %
MCH: 31.7 pg (ref 26.6–33.0)
MCHC: 32.7 g/dL (ref 31.5–35.7)
MCV: 97 fL (ref 79–97)
Monocytes Absolute: 0.3 10*3/uL (ref 0.1–0.9)
Monocytes: 6 %
Neutrophils Absolute: 2.8 10*3/uL (ref 1.4–7.0)
Neutrophils: 63 %
Platelets: 207 10*3/uL (ref 150–450)
RBC: 4.58 x10E6/uL (ref 4.14–5.80)
RDW: 11.8 % (ref 11.6–15.4)
WBC: 4.5 10*3/uL (ref 3.4–10.8)

## 2023-08-25 LAB — COMPREHENSIVE METABOLIC PANEL
ALT: 6 [IU]/L (ref 0–44)
AST: 19 [IU]/L (ref 0–40)
Albumin: 4.8 g/dL (ref 4.1–5.1)
Alkaline Phosphatase: 49 [IU]/L (ref 44–121)
BUN/Creatinine Ratio: 11 (ref 9–20)
BUN: 12 mg/dL (ref 6–24)
Bilirubin Total: 0.9 mg/dL (ref 0.0–1.2)
CO2: 26 mmol/L (ref 20–29)
Calcium: 9.8 mg/dL (ref 8.7–10.2)
Chloride: 102 mmol/L (ref 96–106)
Creatinine, Ser: 1.11 mg/dL (ref 0.76–1.27)
Globulin, Total: 1.9 g/dL (ref 1.5–4.5)
Glucose: 89 mg/dL (ref 70–99)
Potassium: 4.1 mmol/L (ref 3.5–5.2)
Sodium: 141 mmol/L (ref 134–144)
Total Protein: 6.7 g/dL (ref 6.0–8.5)
eGFR: 86 mL/min/{1.73_m2} (ref >59)

## 2023-08-25 LAB — TSH: TSH: 1.43 u[IU]/mL (ref 0.450–4.500)

## 2023-08-25 NOTE — Progress Notes (Signed)
Hi Whilden. It was nice to see you yesterday.  Your lab work looks good.  No concerns at this time. Continue with your current medication regimen.  Follow up as discussed.  Please let me know if you have any questions.

## 2023-08-31 DIAGNOSIS — F5089 Other specified eating disorder: Secondary | ICD-10-CM | POA: Diagnosis not present

## 2023-09-23 DIAGNOSIS — F5089 Other specified eating disorder: Secondary | ICD-10-CM | POA: Diagnosis not present

## 2023-10-21 ENCOUNTER — Other Ambulatory Visit: Payer: Self-pay | Admitting: Nurse Practitioner

## 2023-10-21 DIAGNOSIS — F5089 Other specified eating disorder: Secondary | ICD-10-CM | POA: Diagnosis not present

## 2023-10-21 NOTE — Telephone Encounter (Signed)
Requested Prescriptions  Refused Prescriptions Disp Refills   RABEprazole (ACIPHEX) 20 MG tablet [Pharmacy Med Name: RABEPRAZOLE SOD DR 20 MG TAB] 90 tablet 1    Sig: TAKE 1 TABLET BY MOUTH EVERY DAY     Gastroenterology: Proton Pump Inhibitors Passed - 10/21/2023  9:50 AM      Passed - Valid encounter within last 12 months    Recent Outpatient Visits           1 month ago Annual physical exam   [place] [facility] Larae G[facility], NP   8 months ago Eating disorder with ongoing treatment   [place] [facility] Larae Grooms, NP   1 year ago Annual physical exam   [place] [facility] Larae Grooms, NP   1 year ago Eating disorder with ongoing treatment   [place] [facility] Larae Grooms, NP   1 year ago Sore throat   [place] [facility] Vigg, Avanti, MD       Future Appointments             In 4 months Larae Grooms, NP [place] [facility]

## 2023-11-25 DIAGNOSIS — F5089 Other specified eating disorder: Secondary | ICD-10-CM | POA: Diagnosis not present

## 2023-12-23 DIAGNOSIS — F5089 Other specified eating disorder: Secondary | ICD-10-CM | POA: Diagnosis not present

## 2024-01-20 DIAGNOSIS — F5089 Other specified eating disorder: Secondary | ICD-10-CM | POA: Diagnosis not present

## 2024-02-22 ENCOUNTER — Ambulatory Visit: Payer: Self-pay | Admitting: Nurse Practitioner

## 2024-02-22 ENCOUNTER — Encounter: Payer: Self-pay | Admitting: Nurse Practitioner

## 2024-02-22 VITALS — BP 104/64 | HR 64 | Temp 98.1°F | Resp 17 | Ht 70.98 in | Wt 179.0 lb

## 2024-02-22 DIAGNOSIS — R12 Heartburn: Secondary | ICD-10-CM

## 2024-02-22 DIAGNOSIS — F509 Eating disorder, unspecified: Secondary | ICD-10-CM | POA: Diagnosis not present

## 2024-02-22 MED ORDER — RABEPRAZOLE SODIUM 20 MG PO TBEC: 20.0000 mg | DELAYED_RELEASE_TABLET | Freq: Every day | ORAL | 1 refills | Status: DC

## 2024-02-22 MED ORDER — METOCLOPRAMIDE HCL 5 MG PO TABS: 5.0000 mg | ORAL_TABLET | Freq: Four times a day (QID) | ORAL | 1 refills | Status: DC

## 2024-02-22 MED ORDER — SERTRALINE HCL 50 MG PO TABS: ORAL_TABLET | ORAL | 1 refills | Status: DC

## 2024-02-22 NOTE — Progress Notes (Signed)
 BP 104/64 (BP Location: Left Arm, Patient Position: Sitting, Cuff Size: Normal)   Pulse 64   Temp 98.1 F (36.7 C) (Oral)   Resp 17   Ht 5' 10.98" (1.803 m)   Wt 179 lb (81.2 kg)   SpO2 98%   BMI 24.98 kg/m    Subjective:    Patient ID: Issac Marine., male    DOB: September 27, 1981, 43 y.o.   MRN: [id number]  HPI: Rustin Erhart. is a 43 y.o. male  Chief Complaint  Patient presents with   Follow-up    Still working with dietitician and its going well.    Heartburn    No issues going well.    GERD GERD control status: controlled Satisfied with current treatment? yes Heartburn frequency: daily Medication side effects: no  Medication compliance: stable Can tell when he misses a dose of the Reglan  but feels like symptoms are well controlled when he is taking it.  Patient states he has been working with his dietician and he has been working out more often.  He reengaged with a dietician after his last visit due to his weight loss.  He can tell that he is eating more and clothes are fitting better.     Denies HA, CP, SOB, dizziness, palpitations, visual changes, and lower extremity swelling.  Feels like the Zoloft   is working well for him.  Will continue with this dose.     Relevant past medical, surgical, family and social history reviewed and updated as indicated. Interim medical history since our last visit reviewed. Allergies and medications reviewed and updated.  Review of Systems  Constitutional:  Negative for unexpected weight change.  Gastrointestinal:  Negative for abdominal pain.    Per HPI unless specifically indicated above     Objective:    BP 104/64 (BP Location: Left Arm, Patient Position: Sitting, Cuff Size: Normal)   Pulse 64   Temp 98.1 F (36.7 C) (Oral)   Resp 17   Ht 5' 10.98" (1.803 m)   Wt 179 lb (81.2 kg)   SpO2 98%   BMI 24.98 kg/m   Wt Readings from Last 3 Encounters:  02/22/24 179 lb (81.2 kg)  08/24/23 169 lb  6.4 oz (76.8 kg)  02/22/23 182 lb 1.6 oz (82.6 kg)    Physical Exam Vitals and nursing note reviewed.  Constitutional:      General: He is not in acute distress.    Appearance: Normal appearance. He is not ill-appearing, toxic-appearing or diaphoretic.  HENT:     Head: Normocephalic.     Right Ear: External ear normal.     Left Ear: External ear normal.     Nose: Nose normal. No congestion or rhinorrhea.     Mouth/Throat:     Mouth: Mucous membranes are moist.  Eyes:     General:        Right eye: No discharge.        Left eye: No discharge.     Extraocular Movements: Extraocular movements intact.     Conjunctiva/sclera: Conjunctivae normal.     Pupils: Pupils are equal, round, and reactive to light.  Cardiovascular:     Rate and Rhythm: Normal rate and regular rhythm.     Heart sounds: No murmur heard. Pulmonary:     Effort: Pulmonary effort is normal. No respiratory distress.     Breath sounds: Normal breath sounds. No wheezing, rhonchi or rales.  Abdominal:     General: Abdomen is  flat. Bowel sounds are normal.  Musculoskeletal:     Cervical back: Normal range of motion and neck supple.  Skin:    General: Skin is warm and dry.     Capillary Refill: Capillary refill takes less than 2 seconds.  Neurological:     General: No focal deficit present.     Mental Status: He is alert and oriented to person, place, and time.  Psychiatric:        Mood and Affect: Mood normal.        Behavior: Behavior normal.        Thought Content: Thought content normal.        Judgment: Judgment normal.     Results for orders placed or performed in visit on 08/24/23  Urinalysis, Routine w reflex microscopic   Collection Time: 08/24/23  8:54 AM  Result Value Ref Range   Specific Gravity, UA 1.010 1.005 - 1.030   pH, UA 7.5 5.0 - 7.5   Color, UA Yellow Yellow   Appearance Ur Clear Clear   Leukocytes,UA Negative Negative   Protein,UA Negative Negative/Trace   Glucose, UA Negative  Negative   Ketones, UA Negative Negative   RBC, UA Negative Negative   Bilirubin, UA Negative Negative   Urobilinogen, Ur 0.2 0.2 - 1.0 mg/dL   Nitrite, UA Negative Negative   Microscopic Examination Comment   TSH   Collection Time: 08/24/23  8:56 AM  Result Value Ref Range   TSH 1.430 0.450 - 4.500 uIU/mL  Lipid panel   Collection Time: 08/24/23  8:56 AM  Result Value Ref Range   Cholesterol, Total 153 100 - 199 mg/dL   Triglycerides 55 0 - 149 mg/dL   HDL 73 >47 mg/dL   VLDL Cholesterol Cal 12 5 - 40 mg/dL   LDL Chol Calc (NIH) 68 0 - 99 mg/dL   Chol/HDL Ratio 2.1 0.0 - 5.0 ratio  CBC with Differential/Platelet   Collection Time: 08/24/23  8:56 AM  Result Value Ref Range   WBC 4.5 3.4 - 10.8 x10E3/uL   RBC 4.58 4.14 - 5.80 x10E6/uL   Hemoglobin 14.5 13.0 - 17.7 g/dL   Hematocrit 82.9 56.2 - 51.0 %   MCV 97 79 - 97 fL   MCH 31.7 26.6 - 33.0 pg   MCHC 32.7 31.5 - 35.7 g/dL   RDW 13.0 86.5 - 78.4 %   Platelets 207 150 - 450 x10E3/uL   Neutrophils 63 Not Estab. %   Lymphs 26 Not Estab. %   Monocytes 6 Not Estab. %   Eos 4 Not Estab. %   Basos 1 Not Estab. %   Neutrophils Absolute 2.8 1.4 - 7.0 x10E3/uL   Lymphocytes Absolute 1.2 0.7 - 3.1 x10E3/uL   Monocytes Absolute 0.3 0.1 - 0.9 x10E3/uL   EOS (ABSOLUTE) 0.2 0.0 - 0.4 x10E3/uL   Basophils Absolute 0.0 0.0 - 0.2 x10E3/uL   Immature Granulocytes 0 Not Estab. %   Immature Grans (Abs) 0.0 0.0 - 0.1 x10E3/uL  Comprehensive metabolic panel   Collection Time: 08/24/23  8:56 AM  Result Value Ref Range   Glucose 89 70 - 99 mg/dL   BUN 12 6 - 24 mg/dL   Creatinine, Ser 6.96 0.76 - 1.27 mg/dL   eGFR 86 >29 BM/WUX/3.24   BUN/Creatinine Ratio 11 9 - 20   Sodium 141 134 - 144 mmol/L   Potassium 4.1 3.5 - 5.2 mmol/L   Chloride 102 96 - 106 mmol/L   CO2 26 20 -  29 mmol/L   Calcium 9.8 8.7 - 10.2 mg/dL   Total Protein 6.7 6.0 - 8.5 g/dL   Albumin 4.8 4.1 - 5.1 g/dL   Globulin, Total 1.9 1.5 - 4.5 g/dL   Bilirubin Total 0.9  0.0 - 1.2 mg/dL   Alkaline Phosphatase 49 44 - 121 IU/L   AST 19 0 - 40 IU/L   ALT 6 0 - 44 IU/L      Assessment & Plan:   Problem List Items Addressed This Visit       Other   Heartburn   Chronic.  Controlled.  Continue with current medication regimen.  Refills sent today.  Return to clinic in 6 months for reevaluation.  Call sooner if concerns arise.        Eating disorder with ongoing treatment - Primary   Chronic.  Controlled.  Continue with current medication regimen.  Continue working with [employer].  Has gained about 10lbs since last visit.  Return to clinic in 6 months for reevaluation.  Call sooner if concerns arise.          Follow up plan: Return in about 6 months (around 08/23/2024) for Physical and Fasting labs.

## 2024-02-22 NOTE — Assessment & Plan Note (Signed)
 Chronic.  Controlled.  Continue with current medication regimen.  Continue working with [employer].  Has gained about 10lbs since last visit.  Return to clinic in 6 months for reevaluation.  Call sooner if concerns arise.

## 2024-02-22 NOTE — Assessment & Plan Note (Signed)
Chronic.  Controlled.  Continue with current medication regimen.  Refills sent today.  Return to clinic in 6 months for reevaluation.  Call sooner if concerns arise.

## 2024-03-16 DIAGNOSIS — F5089 Other specified eating disorder: Secondary | ICD-10-CM | POA: Diagnosis not present

## 2024-04-23 DIAGNOSIS — H6123 Impacted cerumen, bilateral: Secondary | ICD-10-CM | POA: Diagnosis not present

## 2024-07-24 ENCOUNTER — Other Ambulatory Visit: Payer: Self-pay | Admitting: Nurse Practitioner

## 2024-07-25 NOTE — Telephone Encounter (Signed)
 Requested Prescriptions  Pending Prescriptions Disp Refills   sertraline  (ZOLOFT ) 50 MG tablet [Pharmacy Med Name: SERTRALINE  HCL 50 MG TABLET] 90 tablet 0    Sig: TAKE 1 TABLET BY MOUTH EVERY DAY IN THE MORNING     Psychiatry:  Antidepressants - SSRI - sertraline  Passed - 07/25/2024 10:31 AM      Passed - AST in normal range and within 360 days    AST  Date Value Ref Range Status  08/24/2023 19 0 - 40 IU/L Final         Passed - ALT in normal range and within 360 days    ALT  Date Value Ref Range Status  08/24/2023 6 0 - 44 IU/L Final         Passed - Completed PHQ-2 or PHQ-9 in the last 360 days      Passed - Valid encounter within last 6 months    Recent Outpatient Visits           5 months ago Eating disorder with ongoing treatment   [place] Melvin Pao, NP               metoCLOPramide  (REGLAN ) 5 MG tablet [Pharmacy Med Name: METOCLOPRAMIDE  5 MG TABLET] 360 tablet 1    Sig: TAKE 1 TABLET (5 MG TOTAL) BY MOUTH IN THE MORNING, AT NOON, IN THE EVENING, AND AT BEDTIME.     Not Delegated - Gastroenterology: Antiemetics - metoclopramide  Failed - 07/25/2024 10:31 AM      Failed - This refill cannot be delegated      Passed - Cr in normal range and within 360 days    Creatinine, Ser  Date Value Ref Range Status  08/24/2023 1.11 0.76 - 1.27 mg/dL Final         Passed - Valid encounter within last 6 months    Recent Outpatient Visits           5 months ago Eating disorder with ongoing treatment   [facility] Melvin Pao, NP

## 2024-07-25 NOTE — Telephone Encounter (Signed)
 Requested medications are due for refill today.  yes  Requested medications are on the active medications list.  yes  Last refill. 02/22/2024 #360 1 rf  Future visit scheduled.   yes  Notes to clinic.  Refill not delegated.    Requested Prescriptions  Pending Prescriptions Disp Refills   metoCLOPramide  (REGLAN ) 5 MG tablet [Pharmacy Med Name: METOCLOPRAMIDE  5 MG TABLET] 360 tablet 1    Sig: TAKE 1 TABLET (5 MG TOTAL) BY MOUTH IN THE MORNING, AT NOON, IN THE EVENING, AND AT BEDTIME.     Not Delegated - Gastroenterology: Antiemetics - metoclopramide  Failed - 07/25/2024 10:31 AM      Failed - This refill cannot be delegated      Passed - Cr in normal range and within 360 days    Creatinine, Ser  Date Value Ref Range Status  08/24/2023 1.11 0.76 - 1.27 mg/dL Final         Passed - Valid encounter within last 6 months    Recent Outpatient Visits           5 months ago Eating disorder with ongoing treatment   [facility] Melvin Pao, NP              Signed Prescriptions Disp Refills   sertraline  (ZOLOFT ) 50 MG tablet 90 tablet 0    Sig: TAKE 1 TABLET BY MOUTH EVERY DAY IN THE MORNING     Psychiatry:  Antidepressants - SSRI - sertraline  Passed - 07/25/2024 10:31 AM      Passed - AST in normal range and within 360 days    AST  Date Value Ref Range Status  08/24/2023 19 0 - 40 IU/L Final         Passed - ALT in normal range and within 360 days    ALT  Date Value Ref Range Status  08/24/2023 6 0 - 44 IU/L Final         Passed - Completed PHQ-2 or PHQ-9 in the last 360 days      Passed - Valid encounter within last 6 months    Recent Outpatient Visits           5 months ago Eating disorder with ongoing treatment   [facility] Melvin Pao, NP

## 2024-08-25 ENCOUNTER — Ambulatory Visit: Payer: Self-pay | Admitting: Nurse Practitioner

## 2024-08-25 ENCOUNTER — Encounter: Payer: Self-pay | Admitting: Nurse Practitioner

## 2024-08-25 VITALS — BP 108/68 | HR 58 | Temp 98.5°F | Ht 71.0 in | Wt 178.2 lb

## 2024-08-25 DIAGNOSIS — Z0000 Encounter for general adult medical examination without abnormal findings: Secondary | ICD-10-CM | POA: Diagnosis not present

## 2024-08-25 DIAGNOSIS — Z136 Encounter for screening for cardiovascular disorders: Secondary | ICD-10-CM

## 2024-08-25 DIAGNOSIS — Z23 Encounter for immunization: Secondary | ICD-10-CM | POA: Diagnosis not present

## 2024-08-25 DIAGNOSIS — K3184 Gastroparesis: Secondary | ICD-10-CM

## 2024-08-25 MED ORDER — RABEPRAZOLE SODIUM 20 MG PO TBEC: 20.0000 mg | DELAYED_RELEASE_TABLET | Freq: Every day | ORAL | 1 refills | Status: AC

## 2024-08-25 MED ORDER — METOCLOPRAMIDE HCL 5 MG PO TABS: 5.0000 mg | ORAL_TABLET | Freq: Four times a day (QID) | ORAL | 1 refills | Status: AC

## 2024-08-25 MED ORDER — SERTRALINE HCL 50 MG PO TABS: ORAL_TABLET | ORAL | 1 refills | Status: AC

## 2024-08-25 NOTE — Progress Notes (Signed)
 BP 108/68   Pulse (!) 58   Temp 98.5 F (36.9 C) (Oral)   Ht 5' 11 (1.803 m)   Wt 178 lb 3.2 oz (80.8 kg)   SpO2 99%   BMI 24.85 kg/m    Subjective:    Patient ID: Antonio Celester Deidra Mickey., male    DOB: 1981/08/17, 43 y.o.   MRN: [id number]  HPI: Antonio Glace. is a 43 y.o. male presenting on 08/25/2024 for comprehensive medical examination. Current medical complaints include:none  He currently lives with: Interim Problems from his last visit: no  GERD GERD control status: controlled Satisfied with current treatment? yes Heartburn frequency: daily Medication side effects: no  Medication compliance: stable Can tell when he misses a dose of the Reglan  but feels like symptoms are well controlled when he is taking it.  Patient states he has been working with his dietician and he has been working out more often.  He was not aware that he has lost 13lbs.  He does not weigh himself at home.    Denies HA, CP, SOB, dizziness, palpitations, visual changes, and lower extremity swelling.  Feels like the Zoloft   is working well for him.  Will continue with this dose.    Depression Screen done today and results listed below:     08/25/2024    8:32 AM 02/22/2024    8:21 AM 08/24/2023    8:36 AM 02/22/2023    8:20 AM 08/21/2022    8:24 AM  Depression screen PHQ 2/9  Decreased Interest 0 0 0 0 0  Down, Depressed, Hopeless 0 0 0 0 0  PHQ - 2 Score 0 0 0 0 0  Altered sleeping 0 0 0 0 0  Tired, decreased energy 0 0 0 0 0  Change in appetite 0 0 0 0 0  Feeling bad or failure about yourself  0 0 0 0 0  Trouble concentrating 0 0 0 0 0  Moving slowly or fidgety/restless 0 0 0 0 0  Suicidal thoughts 0 0 0 0 0  PHQ-9 Score 0 0 0 0 0  Difficult doing work/chores Not difficult at all Not difficult at all Not difficult at all Not difficult at all Not difficult at all    The patient does not have a history of falls. I did complete a risk assessment for falls. A plan of care  for falls was documented.   Past Medical History:  Past Medical History:  Diagnosis Date   Allergy 2018   Allergic to CT scan dye and fire ants   Anxiety 2017   I am coping with this to the point that it does affect me anymore.   Eating disorder    GERD (gastroesophageal reflux disease) 2016   I take Rabeprazole     Surgical History:  Past Surgical History:  Procedure Laterality Date   pilomindal cyst     TESTICLE REMOVAL Right 1996   VASECTOMY  11/2020    Medications:  No current outpatient medications on file prior to visit.   No current facility-administered medications on file prior to visit.    Allergies:  Allergies  Allergen Reactions   Other Hives    IV DYE-HIVES IN ARMPITS    Social History:  Social History   Socioeconomic History   Marital status: Married    Spouse name: Not on file   Number of children: Not on file   Years of education: Not on file   Highest education level: Not  on file  Occupational History   Not on file  Tobacco Use   Smoking status: Never   Smokeless tobacco: Never  Vaping Use   Vaping status: Never Used  Substance and Sexual Activity   Alcohol use: Yes    Alcohol/week: 8.0 standard drinks of alcohol    Types: 8 Cans of beer per week   Drug use: Never   Sexual activity: Yes  Other Topics Concern   Not on file  Social History Narrative   Not on file   Social Drivers of Health   Financial Resource Strain: Low Risk  (08/25/2024)   Overall Financial Resource Strain (CARDIA)    Difficulty of Paying Living Expenses: Not hard at all  Food Insecurity: No Food Insecurity (08/25/2024)   Hunger Vital Sign    Worried About Running Out of Food in the Last Year: Never true    Ran Out of Food in the Last Year: Never true  Transportation Needs: No Transportation Needs (08/25/2024)   PRAPARE - Administrator, Civil Service (Medical): No    Lack of Transportation (Non-Medical): No  Physical Activity: Inactive  (08/25/2024)   Exercise Vital Sign    Days of Exercise per Week: 0 days    Minutes of Exercise per Session: 0 min  Stress: No Stress Concern Present (08/25/2024)   [employer] of Occupational Health - Occupational Stress Questionnaire    Feeling of Stress: Not at all  Social Connections: Socially Isolated (08/25/2024)   Social Connection and Isolation Panel    Frequency of Communication with Friends and Family: Once a week    Frequency of Social Gatherings with Friends and Family: Once a week    Attends Religious Services: Never    Database administrator or Organizations: No    Attends Banker Meetings: Never    Marital Status: Married  Catering manager Violence: Not At Risk (08/25/2024)   Humiliation, Afraid, Rape, and Kick questionnaire    Fear of Current or Ex-Partner: No    Emotionally Abused: No    Physically Abused: No    Sexually Abused: No   Social History   Tobacco Use  Smoking Status Never  Smokeless Tobacco Never   Social History   Substance and Sexual Activity  Alcohol Use Yes   Alcohol/week: 8.0 standard drinks of alcohol   Types: 8 Cans of beer per week    Family History:  Family History  Problem Relation Age of Onset   Hypertension Mother    Hypertension Father    Obesity Father    Cancer Father    Heart disease Father    Obesity Brother    Hypertension Brother    Cancer Paternal Grandmother    Learning disabilities Brother    Obesity Brother     Past medical history, surgical history, medications, allergies, family history and social history reviewed with patient today and changes made to appropriate areas of the chart.   Review of Systems  Constitutional:  Negative for weight loss.  Eyes:  Negative for blurred vision and double vision.  Respiratory:  Negative for shortness of breath.   Cardiovascular:  Negative for chest pain, palpitations and leg swelling.  Gastrointestinal:  Negative for heartburn.  Neurological:   Negative for dizziness and headaches.   All other ROS negative except what is listed above and in the HPI.      Objective:    BP 108/68   Pulse (!) 58   Temp 98.5 F (36.9  C) (Oral)   Ht 5' 11 (1.803 m)   Wt 178 lb 3.2 oz (80.8 kg)   SpO2 99%   BMI 24.85 kg/m   Wt Readings from Last 3 Encounters:  08/25/24 178 lb 3.2 oz (80.8 kg)  02/22/24 179 lb (81.2 kg)  08/24/23 169 lb 6.4 oz (76.8 kg)    Physical Exam Vitals and nursing note reviewed.  Constitutional:      General: He is not in acute distress.    Appearance: Normal appearance. He is normal weight. He is not ill-appearing, toxic-appearing or diaphoretic.  HENT:     Head: Normocephalic.     Right Ear: Tympanic membrane, ear canal and external ear normal.     Left Ear: Tympanic membrane, ear canal and external ear normal.     Nose: Nose normal. No congestion or rhinorrhea.     Mouth/Throat:     Mouth: Mucous membranes are moist.  Eyes:     General:        Right eye: No discharge.        Left eye: No discharge.     Extraocular Movements: Extraocular movements intact.     Conjunctiva/sclera: Conjunctivae normal.     Pupils: Pupils are equal, round, and reactive to light.  Cardiovascular:     Rate and Rhythm: Normal rate and regular rhythm.     Heart sounds: No murmur heard. Pulmonary:     Effort: Pulmonary effort is normal. No respiratory distress.     Breath sounds: Normal breath sounds. No wheezing, rhonchi or rales.  Abdominal:     General: Abdomen is flat. Bowel sounds are normal. There is no distension.     Palpations: Abdomen is soft.     Tenderness: There is no abdominal tenderness. There is no guarding.  Musculoskeletal:     Cervical back: Normal range of motion and neck supple.  Skin:    General: Skin is warm and dry.     Capillary Refill: Capillary refill takes less than 2 seconds.  Neurological:     General: No focal deficit present.     Mental Status: He is alert and oriented to person, place, and  time.     Cranial Nerves: No cranial nerve deficit.     Motor: No weakness.     Deep Tendon Reflexes: Reflexes normal.  Psychiatric:        Mood and Affect: Mood normal.        Behavior: Behavior normal.        Thought Content: Thought content normal.        Judgment: Judgment normal.     Results for orders placed or performed in visit on 08/24/23  Urinalysis, Routine w reflex microscopic   Collection Time: 08/24/23  8:54 AM  Result Value Ref Range   Specific Gravity, UA 1.010 1.005 - 1.030   pH, UA 7.5 5.0 - 7.5   Color, UA Yellow Yellow   Appearance Ur Clear Clear   Leukocytes,UA Negative Negative   Protein,UA Negative Negative/Trace   Glucose, UA Negative Negative   Ketones, UA Negative Negative   RBC, UA Negative Negative   Bilirubin, UA Negative Negative   Urobilinogen, Ur 0.2 0.2 - 1.0 mg/dL   Nitrite, UA Negative Negative   Microscopic Examination Comment   TSH   Collection Time: 08/24/23  8:56 AM  Result Value Ref Range   TSH 1.430 0.450 - 4.500 uIU/mL  Lipid panel   Collection Time: 08/24/23  8:56 AM  Result Value  Ref Range   Cholesterol, Total 153 100 - 199 mg/dL   Triglycerides 55 0 - 149 mg/dL   HDL 73 >60 mg/dL   VLDL Cholesterol Cal 12 5 - 40 mg/dL   LDL Chol Calc (NIH) 68 0 - 99 mg/dL   Chol/HDL Ratio 2.1 0.0 - 5.0 ratio  CBC with Differential/Platelet   Collection Time: 08/24/23  8:56 AM  Result Value Ref Range   WBC 4.5 3.4 - 10.8 x10E3/uL   RBC 4.58 4.14 - 5.80 x10E6/uL   Hemoglobin 14.5 13.0 - 17.7 g/dL   Hematocrit 55.6 62.4 - 51.0 %   MCV 97 79 - 97 fL   MCH 31.7 26.6 - 33.0 pg   MCHC 32.7 31.5 - 35.7 g/dL   RDW 88.1 88.3 - 84.5 %   Platelets 207 150 - 450 x10E3/uL   Neutrophils 63 Not Estab. %   Lymphs 26 Not Estab. %   Monocytes 6 Not Estab. %   Eos 4 Not Estab. %   Basos 1 Not Estab. %   Neutrophils Absolute 2.8 1.4 - 7.0 x10E3/uL   Lymphocytes Absolute 1.2 0.7 - 3.1 x10E3/uL   Monocytes Absolute 0.3 0.1 - 0.9 x10E3/uL   EOS  (ABSOLUTE) 0.2 0.0 - 0.4 x10E3/uL   Basophils Absolute 0.0 0.0 - 0.2 x10E3/uL   Immature Granulocytes 0 Not Estab. %   Immature Grans (Abs) 0.0 0.0 - 0.1 x10E3/uL  Comprehensive metabolic panel   Collection Time: 08/24/23  8:56 AM  Result Value Ref Range   Glucose 89 70 - 99 mg/dL   BUN 12 6 - 24 mg/dL   Creatinine, Ser 8.88 0.76 - 1.27 mg/dL   eGFR 86 >40 fO/fpw/8.26   BUN/Creatinine Ratio 11 9 - 20   Sodium 141 134 - 144 mmol/L   Potassium 4.1 3.5 - 5.2 mmol/L   Chloride 102 96 - 106 mmol/L   CO2 26 20 - 29 mmol/L   Calcium 9.8 8.7 - 10.2 mg/dL   Total Protein 6.7 6.0 - 8.5 g/dL   Albumin 4.8 4.1 - 5.1 g/dL   Globulin, Total 1.9 1.5 - 4.5 g/dL   Bilirubin Total 0.9 0.0 - 1.2 mg/dL   Alkaline Phosphatase 49 44 - 121 IU/L   AST 19 0 - 40 IU/L   ALT 6 0 - 44 IU/L      Assessment & Plan:   Problem List Items Addressed This Visit       Digestive   Gastroparesis   Chronic. Well controlled on current medication regimen of Reglan  5mg  QID.  Recommend finding a new dietician.  However, weight is relatively the same as 6 months ago.  Follow up in 6 months.  Call sooner if concerns arise.       Other Visit Diagnoses       Annual physical exam    -  Primary   Health maintenance reviewed during visit today.  Labs ordered.  Vaccines reviewed.   Relevant Orders   TSH   Lipid panel   CBC with Differential/Platelet   Comprehensive metabolic panel with GFR     Screening for ischemic heart disease       Relevant Orders   Lipid panel     Need for influenza vaccination       Relevant Orders   Flu vaccine trivalent PF, 6mos and older(Flulaval,Afluria,Fluarix,Fluzone) (Completed)          Discussed aspirin prophylaxis for myocardial infarction prevention and decision was it was not indicated  LABORATORY  TESTING:  Health maintenance labs ordered today as discussed above.    IMMUNIZATIONS:   - Tdap: Tetanus vaccination status reviewed: last tetanus booster within 10 years. -  Influenza: Up to date - Pneumovax: Not applicable - Prevnar: Not applicable - HPV: discussed at visit today. - Zostavax vaccine: Not applicable  SCREENING: - Colonoscopy: Not applicable  Discussed with patient purpose of the colonoscopy is to detect colon cancer at curable precancerous or early stages   - AAA Screening: Not applicable  -Hearing Test: Not applicable  -Spirometry: Not applicable   PATIENT COUNSELING:    Sexuality: Discussed sexually transmitted diseases, partner selection, use of condoms, avoidance of unintended pregnancy  and contraceptive alternatives.   Advised to avoid cigarette smoking.  I discussed with the patient that most people either abstain from alcohol or drink within safe limits (<=14/week and <=4 drinks/occasion for males, <=7/weeks and <= 3 drinks/occasion for females) and that the risk for alcohol disorders and other health effects rises proportionally with the number of drinks per week and how often a drinker exceeds daily limits.  Discussed cessation/primary prevention of drug use and availability of treatment for abuse.   Diet: Encouraged to adjust caloric intake to maintain  or achieve ideal body weight, to reduce intake of dietary saturated fat and total fat, to limit sodium intake by avoiding high sodium foods and not adding table salt, and to maintain adequate dietary potassium and calcium preferably from fresh fruits, vegetables, and low-fat dairy products.    stressed the importance of regular exercise  Injury prevention: Discussed safety belts, safety helmets, smoke detector, smoking near bedding or upholstery.   Dental health: Discussed importance of regular tooth brushing, flossing, and dental visits.   Follow up plan: NEXT PREVENTATIVE PHYSICAL DUE IN 1 YEAR. Return in about 6 months (around 02/23/2025) for HTN, HLD, DM2 FU.

## 2024-08-25 NOTE — Assessment & Plan Note (Signed)
 Chronic. Well controlled on current medication regimen of Reglan  5mg  QID.  Recommend finding a new dietician.  However, weight is relatively the same as 6 months ago.  Follow up in 6 months.  Call sooner if concerns arise.

## 2024-08-26 LAB — LIPID PANEL
Chol/HDL Ratio: 2.7 ratio (ref 0.0–5.0)
Cholesterol, Total: 133 mg/dL (ref 100–199)
HDL: 50 mg/dL (ref >39)
LDL Chol Calc (NIH): 67 mg/dL (ref 0–99)
Triglycerides: 84 mg/dL (ref 0–149)
VLDL Cholesterol Cal: 16 mg/dL (ref 5–40)

## 2024-08-26 LAB — CBC WITH DIFFERENTIAL/PLATELET
Basophils Absolute: 0.1 x10E3/uL (ref 0.0–0.2)
Basos: 1 %
EOS (ABSOLUTE): 0.2 x10E3/uL (ref 0.0–0.4)
Eos: 3 %
Hematocrit: 43.3 % (ref 37.5–51.0)
Hemoglobin: 14.5 g/dL (ref 13.0–17.7)
Immature Grans (Abs): 0 x10E3/uL (ref 0.0–0.1)
Immature Granulocytes: 0 %
Lymphocytes Absolute: 1.3 x10E3/uL (ref 0.7–3.1)
Lymphs: 25 %
MCH: 31.5 pg (ref 26.6–33.0)
MCHC: 33.5 g/dL (ref 31.5–35.7)
MCV: 94 fL (ref 79–97)
Monocytes Absolute: 0.3 x10E3/uL (ref 0.1–0.9)
Monocytes: 7 %
Neutrophils Absolute: 3.3 x10E3/uL (ref 1.4–7.0)
Neutrophils: 64 %
Platelets: 245 x10E3/uL (ref 150–450)
RBC: 4.61 x10E6/uL (ref 4.14–5.80)
RDW: 11.8 % (ref 11.6–15.4)
WBC: 5.1 x10E3/uL (ref 3.4–10.8)

## 2024-08-26 LAB — COMPREHENSIVE METABOLIC PANEL WITH GFR
ALT: 5 IU/L (ref 0–44)
AST: 14 IU/L (ref 0–40)
Albumin: 4.5 g/dL (ref 4.1–5.1)
Alkaline Phosphatase: 60 IU/L (ref 47–123)
BUN/Creatinine Ratio: 11 (ref 9–20)
BUN: 12 mg/dL (ref 6–24)
Bilirubin Total: 0.7 mg/dL (ref 0.0–1.2)
CO2: 26 mmol/L (ref 20–29)
Calcium: 9.8 mg/dL (ref 8.7–10.2)
Chloride: 102 mmol/L (ref 96–106)
Creatinine, Ser: 1.09 mg/dL (ref 0.76–1.27)
Globulin, Total: 2.1 g/dL (ref 1.5–4.5)
Glucose: 83 mg/dL (ref 70–99)
Potassium: 4.2 mmol/L (ref 3.5–5.2)
Sodium: 142 mmol/L (ref 134–144)
Total Protein: 6.6 g/dL (ref 6.0–8.5)
eGFR: 87 mL/min/1.73 (ref >59)

## 2024-08-26 LAB — TSH: TSH: 1.51 u[IU]/mL (ref 0.450–4.500)

## 2024-08-28 ENCOUNTER — Ambulatory Visit: Payer: Self-pay | Admitting: Nurse Practitioner

## 2025-02-26 ENCOUNTER — Ambulatory Visit: Admitting: Nurse Practitioner
